# Patient Record
Sex: Female | Born: 1985 | Race: White | Hispanic: No | Marital: Single | Smoking: Current some day smoker
Health system: Southern US, Community
[De-identification: ages and names within clinical notes are randomized; demographics above are authoritative.]

## PROBLEM LIST (undated history)

## (undated) DIAGNOSIS — J45909 Unspecified asthma, uncomplicated: Secondary | ICD-10-CM

## (undated) DIAGNOSIS — F419 Anxiety disorder, unspecified: Secondary | ICD-10-CM

## (undated) DIAGNOSIS — R87629 Unspecified abnormal cytological findings in specimens from vagina: Secondary | ICD-10-CM

## (undated) HISTORY — DX: Anxiety disorder, unspecified: F41.9

## (undated) HISTORY — DX: Unspecified abnormal cytological findings in specimens from vagina: R87.629

## (undated) HISTORY — PX: EYE SURGERY: SHX253

## (undated) HISTORY — DX: Unspecified asthma, uncomplicated: J45.909

## (undated) HISTORY — PX: COLPOSCOPY: SHX161

## (undated) HISTORY — PX: LEEP: SHX91

---

## 2008-04-28 ENCOUNTER — Emergency Department: Payer: Self-pay | Admitting: Emergency Medicine

## 2008-11-15 ENCOUNTER — Emergency Department: Payer: Self-pay | Admitting: Emergency Medicine

## 2009-05-15 ENCOUNTER — Encounter: Payer: Self-pay | Admitting: Obstetrics and Gynecology

## 2009-10-19 ENCOUNTER — Observation Stay: Payer: Self-pay | Admitting: Obstetrics and Gynecology

## 2009-10-24 ENCOUNTER — Inpatient Hospital Stay: Payer: Self-pay | Admitting: Obstetrics and Gynecology

## 2009-10-29 IMAGING — US US OB < 14 WEEKS - US OB TV
1 series · 17 of 28 positions shown · non-contrast
Comparison: none

REASON FOR EXAM: Bleeding, uncertain dates
COMMENTS:

[Series 1: us ob < 14 weeks - us ob tv · 17 of 80 slices shown]
[im 1/80]
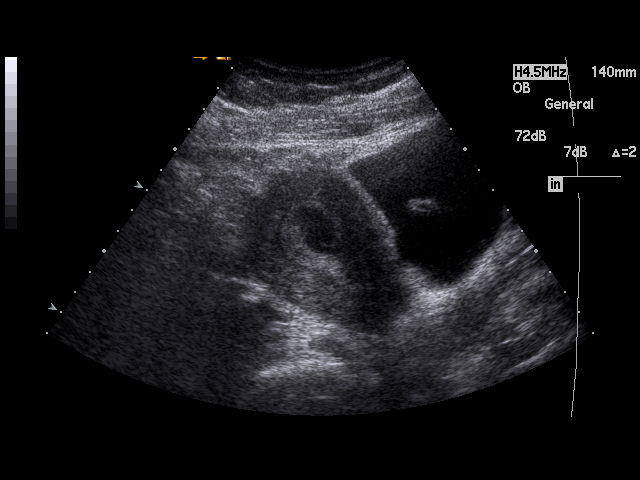
[im 6/80]
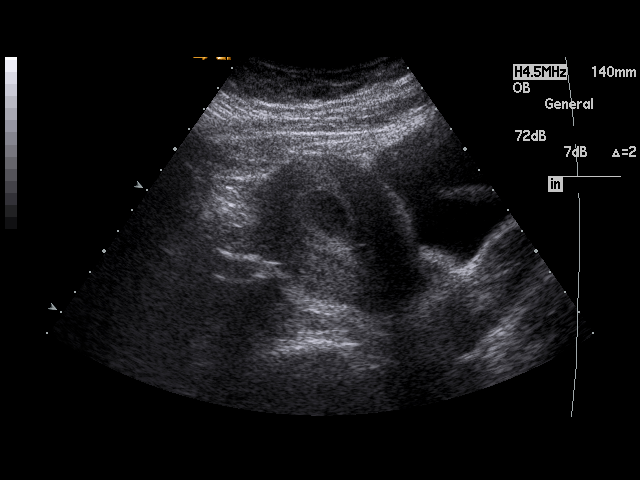
[im 12/80]
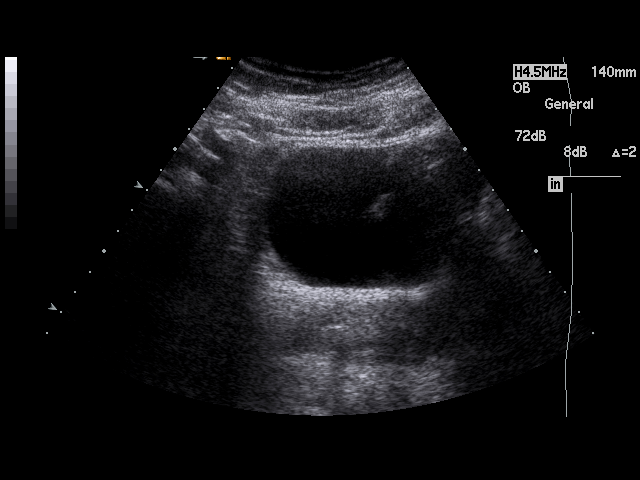
[im 15/80]
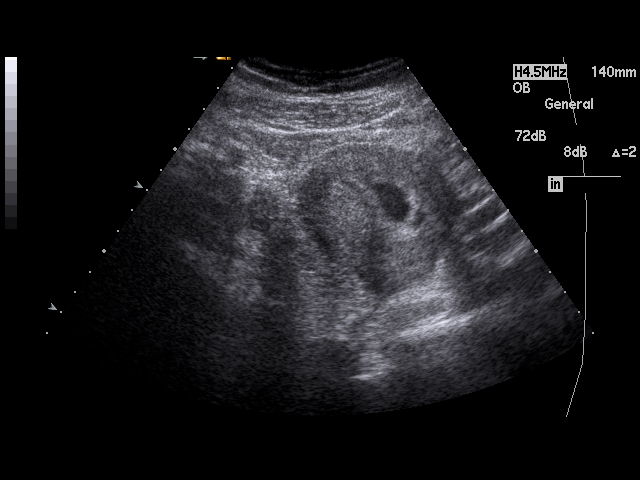
[im 21/80]
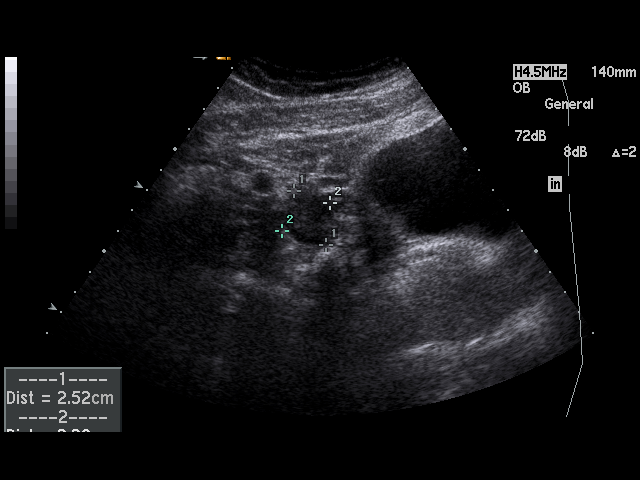
[im 27/80]
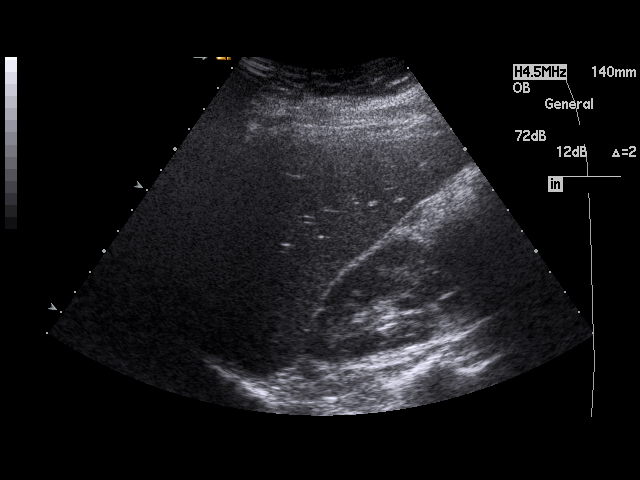
[im 30/80]
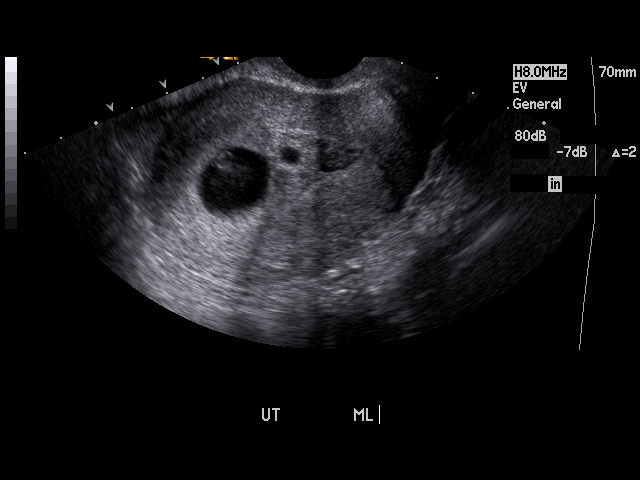
[im 36/80]
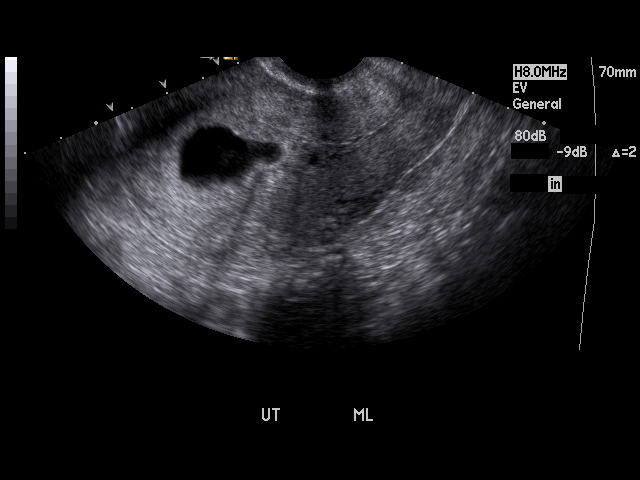
[im 41/80]
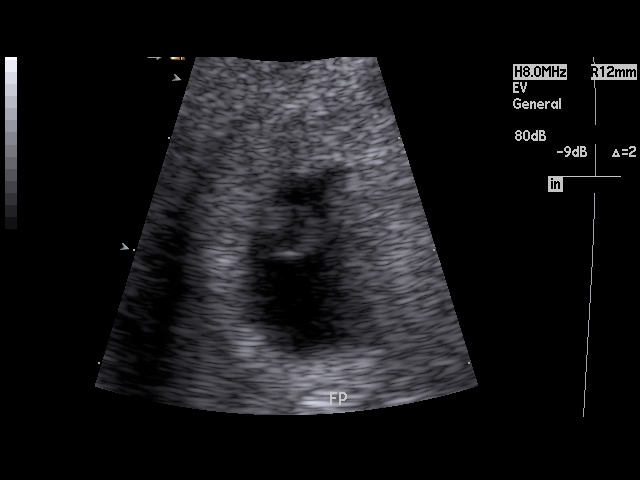
[im 44/80]
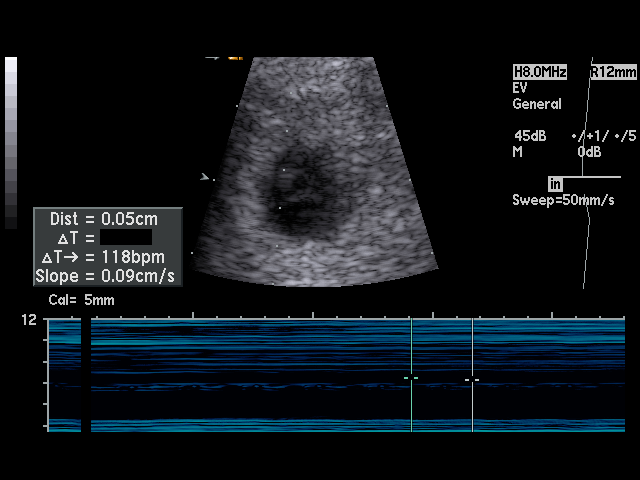
[im 50/80]
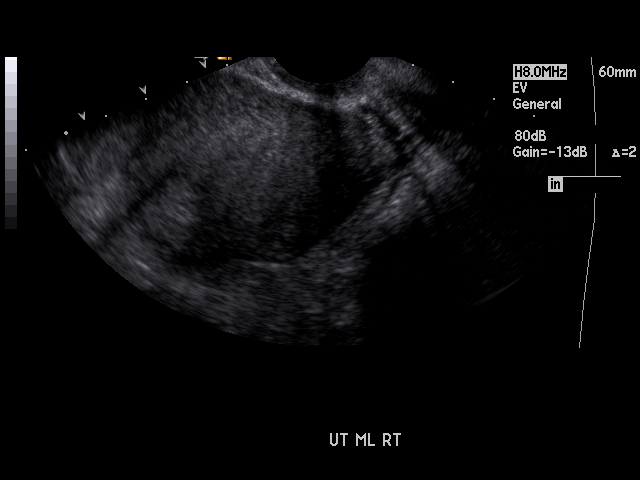
[im 53/80]
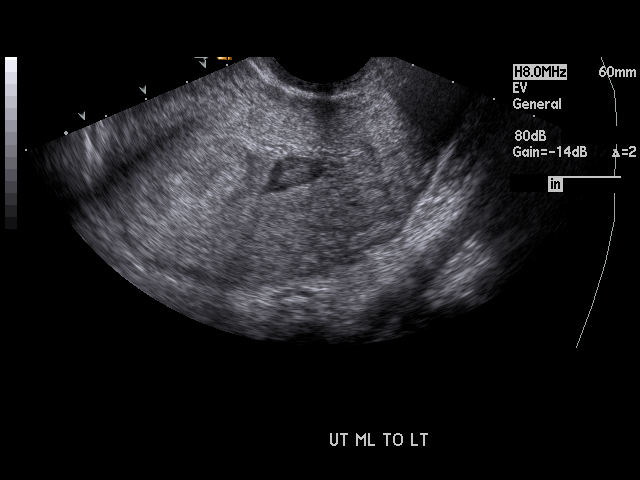
[im 59/80]
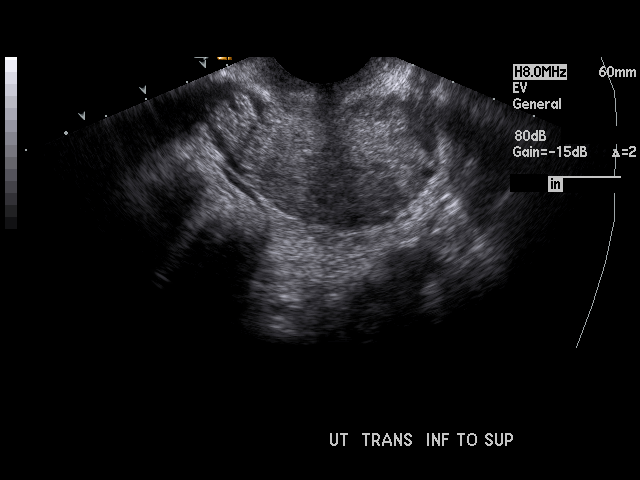
[im 65/80]
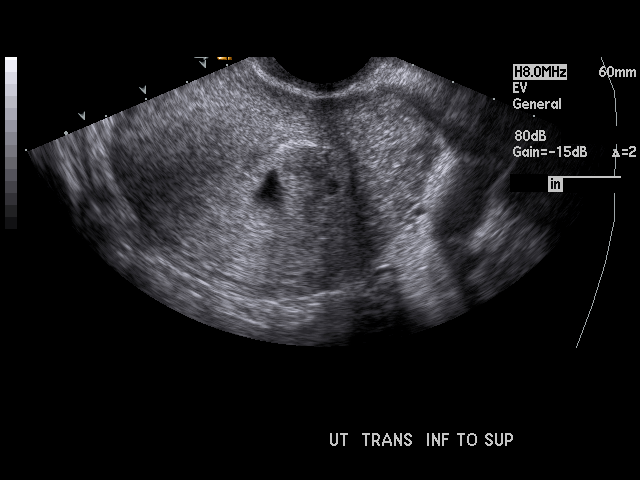
[im 68/80]
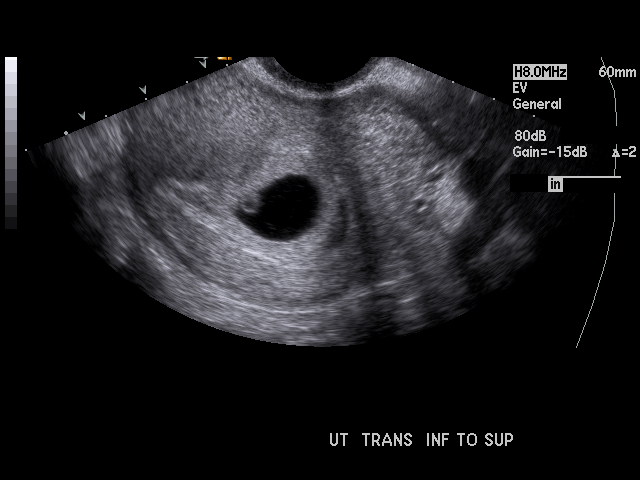
[im 74/80]
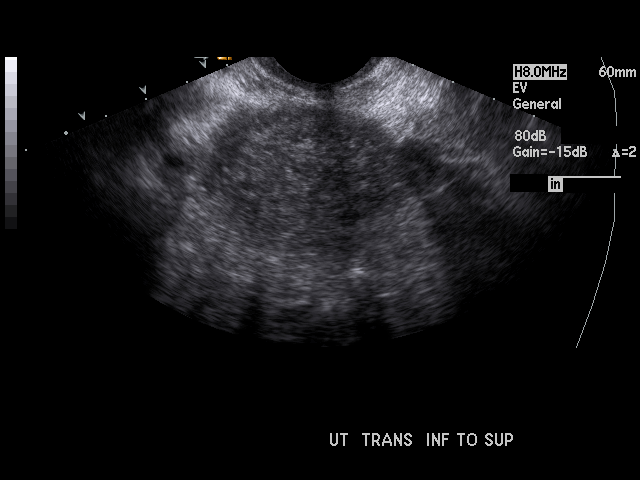
[im 80/80]
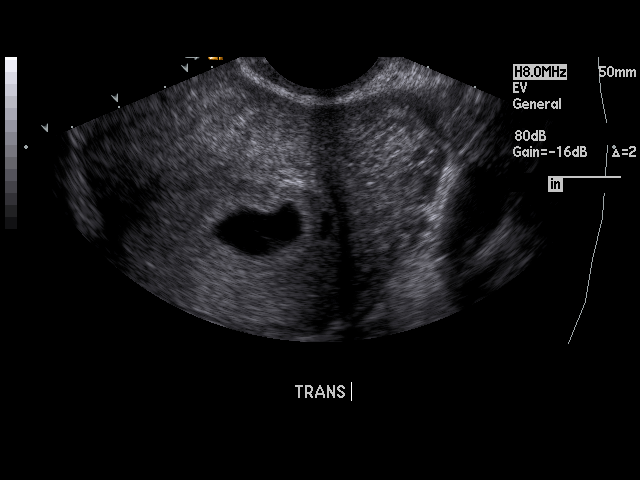

[17 of 28 positions shown; findings below may reference images not displayed]

PROCEDURE:     US  - US OB LESS THAN 14 WEEKS/W TRANS  - November 15, 2008  [DATE]

RESULT:     Transabdominal and endovaginal ultrasound was performed. There
is observed a living intrauterine gestation. Embryo heart rate was monitored
at 118 beats per minute. The crown-rump length measures 0.21 cm which
corresponds to an estimated gestational age of 5 weeks, 5 days. Ultrasound
EDD is 07/13/2009. There is noted a subchorionic bleed. There is a lucent
tract extending into the endometrial canal and consistent with blood and
thrombus.
IMPRESSION: 1.  Living intrauterine gestation of approximately 5 weeks, 5 days
gestational age.
2.  There is a subchorionic bleed with a linear tract visualized in the
endometrial canal compatible with blood and clot that measures approximately
3.95 cm x 0.8 cm.

## 2012-12-29 ENCOUNTER — Ambulatory Visit: Payer: Self-pay | Admitting: Family Medicine

## 2014-09-13 DIAGNOSIS — Z8709 Personal history of other diseases of the respiratory system: Secondary | ICD-10-CM | POA: Insufficient documentation

## 2014-12-30 DIAGNOSIS — Z9889 Other specified postprocedural states: Secondary | ICD-10-CM

## 2014-12-30 HISTORY — DX: Other specified postprocedural states: Z98.890

## 2015-07-05 DIAGNOSIS — N879 Dysplasia of cervix uteri, unspecified: Secondary | ICD-10-CM | POA: Insufficient documentation

## 2016-09-26 DIAGNOSIS — Z8742 Personal history of other diseases of the female genital tract: Secondary | ICD-10-CM | POA: Insufficient documentation

## 2016-09-26 DIAGNOSIS — Z8669 Personal history of other diseases of the nervous system and sense organs: Secondary | ICD-10-CM | POA: Insufficient documentation

## 2018-07-28 LAB — HM PAP SMEAR: HM Pap smear: NEGATIVE

## 2018-07-28 LAB — HM HIV SCREENING LAB: HM HIV Screening: NEGATIVE

## 2019-08-24 DIAGNOSIS — Z8709 Personal history of other diseases of the respiratory system: Secondary | ICD-10-CM

## 2019-08-24 DIAGNOSIS — Z8669 Personal history of other diseases of the nervous system and sense organs: Secondary | ICD-10-CM

## 2019-08-24 DIAGNOSIS — Z8742 Personal history of other diseases of the female genital tract: Secondary | ICD-10-CM

## 2019-08-25 ENCOUNTER — Ambulatory Visit: Payer: Self-pay

## 2019-10-14 ENCOUNTER — Ambulatory Visit: Payer: Self-pay

## 2019-10-20 ENCOUNTER — Ambulatory Visit: Payer: Self-pay

## 2020-10-27 ENCOUNTER — Ambulatory Visit (LOCAL_COMMUNITY_HEALTH_CENTER): Payer: Self-pay

## 2020-10-27 ENCOUNTER — Other Ambulatory Visit: Payer: Self-pay

## 2020-10-27 VITALS — BP 130/83 | Ht 65.0 in | Wt 218.5 lb

## 2020-10-27 DIAGNOSIS — Z3201 Encounter for pregnancy test, result positive: Secondary | ICD-10-CM

## 2020-10-27 LAB — PREGNANCY, URINE: Preg Test, Ur: POSITIVE — AB

## 2020-10-27 NOTE — Progress Notes (Signed)
Pt desires prenatal care at [facility]; sent to preadmit. 

## 2020-12-07 ENCOUNTER — Ambulatory Visit: Payer: Medicaid Other | Admitting: Family Medicine

## 2020-12-07 ENCOUNTER — Encounter: Payer: Self-pay | Admitting: Physician Assistant

## 2020-12-07 ENCOUNTER — Other Ambulatory Visit: Payer: Self-pay

## 2020-12-07 VITALS — BP 130/80 | HR 99 | Temp 98.1°F | Wt 219.8 lb

## 2020-12-07 DIAGNOSIS — O0990 Supervision of high risk pregnancy, unspecified, unspecified trimester: Secondary | ICD-10-CM | POA: Insufficient documentation

## 2020-12-07 DIAGNOSIS — Z8742 Personal history of other diseases of the female genital tract: Secondary | ICD-10-CM

## 2020-12-07 DIAGNOSIS — O09519 Supervision of elderly primigravida, unspecified trimester: Secondary | ICD-10-CM

## 2020-12-07 DIAGNOSIS — O99210 Obesity complicating pregnancy, unspecified trimester: Secondary | ICD-10-CM

## 2020-12-07 DIAGNOSIS — Z8709 Personal history of other diseases of the respiratory system: Secondary | ICD-10-CM

## 2020-12-07 LAB — HEMOGLOBIN, FINGERSTICK: Hemoglobin: 12.7 g/dL (ref 11.1–15.9)

## 2020-12-07 LAB — OB RESULTS CONSOLE GC/CHLAMYDIA
Chlamydia: NEGATIVE
Gonorrhea: NEGATIVE

## 2020-12-07 LAB — URINALYSIS
Bilirubin, UA: NEGATIVE
Glucose, UA: NEGATIVE
Ketones, UA: NEGATIVE
Leukocytes,UA: NEGATIVE
Nitrite, UA: NEGATIVE
Protein,UA: NEGATIVE
RBC, UA: NEGATIVE
Specific Gravity, UA: 1.025 (ref 1.005–1.030)
Urobilinogen, Ur: 0.2 mg/dL (ref 0.2–1.0)
pH, UA: 7 (ref 5.0–7.5)

## 2020-12-07 LAB — WET PREP FOR TRICH, YEAST, CLUE
Trichomonas Exam: NEGATIVE
Yeast Exam: NEGATIVE

## 2020-12-07 LAB — OB RESULTS CONSOLE VARICELLA ZOSTER ANTIBODY, IGG: Varicella: IMMUNE

## 2020-12-07 NOTE — Progress Notes (Addendum)
Wet mount reviewed, no treatment indicated. Urine dip and Hgb reviewed with provider, no new orders. Patient peak flows:  320, 310, 390, with good effort, reviewed by provider. [facility] referral for 1st trimester screening with U/S faxed with confirmation. Patient counseled to go to [facility] today or at next appointment. Patient declined flu vaccine today, but agreed to consider it for next visit.Burt Knack, RN

## 2020-12-07 NOTE — Progress Notes (Signed)
[facility] HEALTH DEPT [facility] [address] [place] [address] [place] [address] [phone]  INITIAL PRENATAL VISIT NOTE  Subjective:  Anne Harrington is a 34 y.o. [phone] at [redacted]w[redacted]d being seen today to start prenatal care at the [facility].  She is currently monitored for the following issues for this high-risk pregnancy and has History of migraine headaches; History of abnormal cervical Pap smear; History of asthma; Supervision of high risk pregnancy, antepartum; Obesity in pregnancy, antepartum; and Advanced maternal age, primigravida, antepartum on their problem list.  Patient reports no complaints.  Contractions: Not present. Vag. Bleeding: None.  Movement: Present. Denies leaking of fluid.   Indications for ASA therapy (per uptodate) One of the following: Previous pregnancy with preeclampsia, especially early onset and with an adverse outcome No Multifetal gestation No Chronic hypertension No Type 1 or 2 diabetes mellitus No Chronic kidney disease No Autoimmune disease (antiphospholipid syndrome, systemic lupus erythematosus) No  Two or more of the following: Nulliparity No Obesity (body mass index >30 kg/m2) Yes Family history of preeclampsia in mother or sister No Age ?35 years Yes Sociodemographic characteristics (African American race, low socioeconomic level) No Personal risk factors (eg, previous pregnancy with low birth weight or small for gestational age infant, previous adverse pregnancy outcome [eg, stillbirth], interval >10 years between pregnancies) Yes   The following portions of the patient's history were reviewed and updated as appropriate: allergies, current medications, past family history, past medical history, past social history, past surgical history and problem list. Problem list updated.  Objective:   Vitals:   12/07/20 1328  BP: 130/80  Pulse: 99  Temp: 98.1 F (36.7 C)  Weight: 219 lb  12.8 oz (99.7 kg)    Fetal Status: Fetal Heart Rate (bpm): 163 Fundal Height: 12 cm Movement: Present     Physical Exam Vitals and nursing note reviewed.  Constitutional:      Appearance: Normal appearance.  HENT:     Head: Normocephalic and atraumatic.     Mouth/Throat:     Mouth: Mucous membranes are moist.     Pharynx: Oropharynx is clear. No oropharyngeal exudate or posterior oropharyngeal erythema.     Comments: Broken right bottom molar  Eyes:     General: No scleral icterus. Cardiovascular:     Rate and Rhythm: Normal rate.     Pulses: Normal pulses.  Pulmonary:     Effort: Pulmonary effort is normal.  Abdominal:     General: Abdomen is flat. Bowel sounds are normal.     Palpations: Abdomen is soft.  Genitourinary:    Comments:  External genitalia without, lice, nits, erythema, edema , lesions or inguinal adenopathy. Vagina with normal mucosa and discharge and pH equals 4.    Cervix without visual lesions, uterus firm, mobile, non-tender, no masses, CMT adnexal fullness or tenderness.   Musculoskeletal:        General: Normal range of motion.     Cervical back: Normal range of motion and neck supple.  Lymphadenopathy:     Cervical: No cervical adenopathy.  Skin:    General: Skin is warm and dry.  Neurological:     General: No focal deficit present.     Mental Status: She is alert and oriented to person, place, and time.  Psychiatric:        Mood and Affect: Mood normal.        Behavior: Behavior normal.     Assessment and Plan:  Pregnancy:  [id number] at [redacted]w[redacted]d  1. Supervision of high risk pregnancy, antepartum  Patient is happy about this pregnancy.  She lives with her 64 yr old son.  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  The FOB is Alona Bene and is supportive of this pregnancy.    She does have nausea in the mornings and not able to eat meals until after mid morning, she has been taking gummy PNV daily, she reports  was not able  to keep the tablets down .  Discussed methods to help with n/v, information sheet given.    Denies chronic medical issues other than asthma   Last dental visit was 5/202, patient had broken bottom molar on right side.  Encourage patient to continue with dental as needed.   2. Obesity in pregnancy, antepartum Discussed recommended wt gain (11-20 lbs), healthy diet, and physical activity in pregnancy. Pt accepts  MNT referral. -Early GTT today, -Pt informed of starting  81mg  aspirin daily from 12-[redacted] wks gestation. Handout with instructions given.  -May consider serial growth scans starting at 28 wks.   3. History of abnormal cervical Pap smear Patient has history of previous abnormal pap with leep in 2016 and colpo in 2017.   Last pap was 2019 with cotesting- neg.    4. History of asthma Patient reports last asthma attack was in 04/2020, when postiive for covid.  Patient has albuterol inhaler.    PFT  Done today  5. Advanced maternal age, antepartum  Discussed genetic  counseling  Patient accepts testing  Discussed overview of care and coordination with inpatient delivery practices including [facility], 05/2020, [facility] and [facility].   Patient chooses [facility]    Preterm labor symptoms and general obstetric precautions including but not limited to vaginal bleeding, contractions, leaking of fluid and fetal movement were reviewed in detail with the patient.  Please refer to After Visit Summary for other counseling recommendations.   Return in about 4 weeks (around 01/04/2021) for routine prenatal care.  Future Appointments  Date Time Provider Department Center  01/04/2021  2:00 PM [facility] PROVIDER [facility] None    03/04/2021, FNP

## 2020-12-07 NOTE — Progress Notes (Signed)
Patient here for new OB visit at 11 6/7. States she had covid 19 in February 2020 and states she has some "pressure around my heart" that "intensifies with menstruation". She states she was smoking when she got covid and quit afterward to try to improve chest symptoms. This is why she has anxiety. Patient lives with her 35 year old son. 1 hour gtt blood draw at 3:15, patient needs to be to lab about 5-10 minutes before that time.Marland KitchenMarland KitchenBurt Knack, RN

## 2020-12-08 ENCOUNTER — Other Ambulatory Visit: Payer: Self-pay | Admitting: Family Medicine

## 2020-12-08 DIAGNOSIS — O26899 Other specified pregnancy related conditions, unspecified trimester: Secondary | ICD-10-CM | POA: Insufficient documentation

## 2020-12-08 DIAGNOSIS — Z369 Encounter for antenatal screening, unspecified: Secondary | ICD-10-CM

## 2020-12-08 DIAGNOSIS — Z6791 Unspecified blood type, Rh negative: Secondary | ICD-10-CM | POA: Insufficient documentation

## 2020-12-08 LAB — CBC/D/PLT+RPR+RH+ABO+AB SCR
Antibody Screen: NEGATIVE
Basophils Absolute: 0 10*3/uL (ref 0.0–0.2)
Basos: 0 %
EOS (ABSOLUTE): 0 10*3/uL (ref 0.0–0.4)
Eos: 0 %
Hematocrit: 36.8 % (ref 34.0–46.6)
Hemoglobin: 12.7 g/dL (ref 11.1–15.9)
Hepatitis B Surface Ag: NEGATIVE
Immature Grans (Abs): 0 10*3/uL (ref 0.0–0.1)
Immature Granulocytes: 0 %
Lymphocytes Absolute: 2.1 10*3/uL (ref 0.7–3.1)
Lymphs: 19 %
MCH: 32.1 pg (ref 26.6–33.0)
MCHC: 34.5 g/dL (ref 31.5–35.7)
MCV: 93 fL (ref 79–97)
Monocytes Absolute: 0.6 10*3/uL (ref 0.1–0.9)
Monocytes: 6 %
Neutrophils Absolute: 8 10*3/uL — ABNORMAL HIGH (ref 1.4–7.0)
Neutrophils: 75 %
Platelets: 255 10*3/uL (ref 150–450)
RBC: 3.96 x10E6/uL (ref 3.77–5.28)
RDW: 11.9 % (ref 11.7–15.4)
RPR Ser Ql: NONREACTIVE
Rh Factor: NEGATIVE
WBC: 10.8 10*3/uL (ref 3.4–10.8)

## 2020-12-08 LAB — COMPREHENSIVE METABOLIC PANEL
ALT: 39 IU/L — ABNORMAL HIGH (ref 0–32)
AST: 24 IU/L (ref 0–40)
Albumin/Globulin Ratio: 1.4 (ref 1.2–2.2)
Albumin: 4.1 g/dL (ref 3.8–4.8)
Alkaline Phosphatase: 69 IU/L (ref 44–121)
BUN/Creatinine Ratio: 8 — ABNORMAL LOW (ref 9–23)
BUN: 5 mg/dL — ABNORMAL LOW (ref 6–20)
Bilirubin Total: <0.2 mg/dL (ref 0.0–1.2)
CO2: 24 mmol/L (ref 20–29)
Calcium: 9.5 mg/dL (ref 8.7–10.2)
Chloride: 103 mmol/L (ref 96–106)
Creatinine, Ser: 0.62 mg/dL (ref 0.57–1.00)
GFR calc Af Amer: 135 mL/min/{1.73_m2} (ref >59)
GFR calc non Af Amer: 117 mL/min/{1.73_m2} (ref >59)
Globulin, Total: 3 g/dL (ref 1.5–4.5)
Glucose: 82 mg/dL (ref 65–99)
Potassium: 3.6 mmol/L (ref 3.5–5.2)
Sodium: 140 mmol/L (ref 134–144)
Total Protein: 7.1 g/dL (ref 6.0–8.5)

## 2020-12-08 LAB — GLUCOSE, 1 HOUR GESTATIONAL: Gestational Diabetes Screen: 72 mg/dL (ref 65–139)

## 2020-12-08 LAB — LEAD, BLOOD (ADULT >= 16 YRS): Lead-Whole Blood: <1 ug/dL (ref 0–4)

## 2020-12-08 LAB — VARICELLA ZOSTER ANTIBODY, IGG: Varicella zoster IgG: 1990 index (ref >165)

## 2020-12-08 LAB — HGB A1C W/O EAG: Hgb A1c MFr Bld: 4.8 % (ref 4.8–5.6)

## 2020-12-08 LAB — HCV AB W REFLEX TO QUANT PCR: HCV Ab: <0.1 s/co ratio (ref 0.0–0.9)

## 2020-12-08 LAB — HCV INTERPRETATION

## 2020-12-08 LAB — TSH: TSH: 2.53 u[IU]/mL (ref 0.450–4.500)

## 2020-12-08 LAB — HIV ANTIBODY (ROUTINE TESTING W REFLEX): HIV Screen 4th Generation wRfx: NONREACTIVE

## 2020-12-11 ENCOUNTER — Encounter: Payer: Self-pay | Admitting: Family Medicine

## 2020-12-11 ENCOUNTER — Telehealth: Payer: Self-pay

## 2020-12-11 ENCOUNTER — Telehealth: Payer: Self-pay | Admitting: Family Medicine

## 2020-12-11 DIAGNOSIS — R799 Abnormal finding of blood chemistry, unspecified: Secondary | ICD-10-CM | POA: Insufficient documentation

## 2020-12-11 LAB — CHLAMYDIA/GC NAA, CONFIRMATION
Chlamydia trachomatis, NAA: NEGATIVE
Neisseria gonorrhoeae, NAA: NEGATIVE

## 2020-12-11 NOTE — Telephone Encounter (Signed)
Call to client to ascertain if aware of 12/14/2020 1000 genetic counseling and 1100 [place] appt at [facility]. Per client, was notified this am of appt. Jossie Ng, RN

## 2020-12-11 NOTE — Telephone Encounter (Signed)
Call to patient about appointment for genetics and U/S on 12/16 @ 10 am @[facility]  medical Mall.   Patient verbalized understanding.    , FNP

## 2020-12-13 LAB — PROTEIN / CREATININE RATIO, URINE
Creatinine, Urine: 149 mg/dL
Protein, Ur: 7.5 mg/dL
Protein/Creat Ratio: 50 mg/g creat (ref 0–200)

## 2020-12-13 LAB — URINE CULTURE

## 2020-12-14 ENCOUNTER — Ambulatory Visit (HOSPITAL_BASED_OUTPATIENT_CLINIC_OR_DEPARTMENT_OTHER): Payer: Medicaid Other

## 2020-12-14 ENCOUNTER — Other Ambulatory Visit: Payer: Self-pay

## 2020-12-14 ENCOUNTER — Other Ambulatory Visit
Admission: RE | Admit: 2020-12-14 | Discharge: 2020-12-14 | Disposition: A | Discharge status: Home / Self Care | Payer: Medicaid Other | Source: Ambulatory Visit | Attending: Obstetrics | Admitting: Obstetrics

## 2020-12-14 VITALS — BP 130/78 | HR 98 | Temp 98.1°F | Wt 224.5 lb

## 2020-12-14 DIAGNOSIS — Z369 Encounter for antenatal screening, unspecified: Secondary | ICD-10-CM | POA: Diagnosis not present

## 2020-12-14 DIAGNOSIS — R799 Abnormal finding of blood chemistry, unspecified: Secondary | ICD-10-CM

## 2020-12-14 DIAGNOSIS — Z3689 Encounter for other specified antenatal screening: Secondary | ICD-10-CM | POA: Diagnosis not present

## 2020-12-14 DIAGNOSIS — Z3A12 12 weeks gestation of pregnancy: Secondary | ICD-10-CM | POA: Diagnosis not present

## 2020-12-14 DIAGNOSIS — O09521 Supervision of elderly multigravida, first trimester: Secondary | ICD-10-CM

## 2020-12-14 DIAGNOSIS — Z8279 Family history of other congenital malformations, deformations and chromosomal abnormalities: Secondary | ICD-10-CM

## 2020-12-14 DIAGNOSIS — O351XX0 Maternal care for (suspected) chromosomal abnormality in fetus, not applicable or unspecified: Secondary | ICD-10-CM

## 2020-12-14 NOTE — Progress Notes (Signed)
Referring Provider:  [facility] Department Length of Consultation: 40 minutes  Anne Harrington was referred to Maternal Fetal Care at [facility] for genetic counseling because of advanced maternal age and a family history of spina bifida.  The patient will be 34 years old at the time of delivery.  This note summarizes the information we discussed.    We explained that the chance of a chromosome abnormality increases with maternal age.  Chromosomes and examples of chromosome problems were reviewed.  Humans typically have 46 chromosomes in each cell, with half passed through each sperm and egg.  Any change in the number or structure of chromosomes can increase the risk of problems in the physical and mental development of a pregnancy.   Based upon age of the patient and the current gestational age, the chance of any chromosome abnormality was 1 in 75. The chance of Down syndrome, the most common chromosome problem associated with maternal age, was 1 in 103.  The risk of chromosome problems is in addition to the 3% general population risk for birth defects and intellectual disabilities.  The greatest chance, of course, is that the baby would be born in good health.  We discussed the following prenatal screening and testing options for this pregnancy:  Cell free fetal DNA testing analyzes maternal blood to determine whether or not the baby may have Down syndrome, trisomy 38, or trisomy 39.  This test utilizes a maternal blood sample and DNA sequencing technology to isolate circulating cell free fetal DNA from maternal plasma.  The fetal DNA can then be analyzed for DNA sequences that are derived from the three most common chromosomes involved in aneuploidy, chromosomes 13, 18, and 21.  If the overall amount of DNA is greater than the expected level for any of these chromosomes, aneuploidy is suspected.  While we do not consider it a replacement for invasive testing and karyotype analysis, a  negative result from this testing would be reassuring, though not a guarantee of a normal chromosome complement for the baby.  An abnormal result is certainly suggestive of an abnormal chromosome complement, though we would still recommend CVS or amniocentesis to confirm any findings from this testing.  First trimester screening, is another blood test which includes nuchal translucency ultrasound screen and first trimester maternal serum marker screening.  The nuchal translucency has approximately an 80% detection rate for Down syndrome and can be positive for other chromosome abnormalities as well as heart defects.  When combined with a maternal serum marker screening, the detection rate is up to 90% for Down syndrome and up to 97% for trisomy 18.     The chorionic villus sampling procedure is available for first trimester chromosome analysis.  This involves the withdrawal of a small amount of chorionic villi (tissue from the developing placenta).  Risk of pregnancy loss is estimated to be approximately 1 in 200 to 1 in 100 (0.5 to 1%).  There is approximately a 1% (1 in 100) chance that the CVS chromosome results will be unclear.  Chorionic villi cannot be tested for neural tube defects.     Maternal serum marker screening, a blood test that measures pregnancy proteins, can provide risk assessments for Down syndrome, trisomy 18, and open neural tube defects (spina bifida, anencephaly). Because it does not directly examine the fetus, it cannot positively diagnose or rule out these problems. The detection rate is approximately 75% for Down syndrome, 70% for trisomy 18 and 80% of open neural tube  defects. If prior chromosome screening has been performed, then AFP only is recommended to test for open neural tube defects alone.  Targeted ultrasound uses high frequency sound waves to create an image of the developing fetus.  An ultrasound is often recommended as a routine means of evaluating the pregnancy.  It  is also used to screen for fetal anatomy problems (for example, a heart defect) that might be suggestive of a chromosomal or other abnormality.   Amniocentesis involves the removal of a small amount of amniotic fluid from the sac surrounding the fetus with the use of a thin needle inserted through the maternal abdomen and uterus.  Ultrasound guidance is used throughout the procedure.  Fetal cells from amniotic fluid are directly evaluated and > 99.5% of chromosome problems and > 98% of open neural tube defects can be detected. This procedure is generally performed after the 15th week of pregnancy.  The main risks to this procedure include complications leading to miscarriage in less than 1 in 200 cases (0.5%).  Cystic Fibrosis and Spinal Muscular Atrophy (SMA) screening were also discussed with the patient. Both conditions are recessive, which means that both parents must be carriers in order to have a child with the disease.  Cystic fibrosis (CF) is one of the most common genetic conditions in persons of Caucasian ancestry.  This condition occurs in approximately 1 in 2,500 Caucasian persons and results in thickened secretions in the lungs, digestive, and reproductive systems.  For a baby to be at risk for having CF, both of the parents must be carriers for this condition.  Approximately 1 in 75 Caucasian persons is a carrier for CF.  Current carrier testing looks for the most common mutations in the gene for CF and can detect approximately 90% of carriers in the Caucasian population.  This means that the carrier screening can greatly reduce, but cannot eliminate, the chance for an individual to have a child with CF.  If an individual is found to be a carrier for CF, then carrier testing would be available for the partner. As part of [place]'s newborn screening profile, all babies born in the state of [place] will have a two-tier screening process.  Specimens are first tested to determine the  concentration of immunoreactive trypsinogen (IRT).  The top 5% of specimens with the highest IRT values then undergo DNA testing using a panel of over 40 common CF mutations. SMA is a neurodegenerative disorder that leads to atrophy of skeletal muscle and overall weakness.  This condition is also more prevalent in the Caucasian population, with 1 in 40-1 in 60 persons being a carrier and 1 in 6,000-1 in 10,000 children being affected.  There are multiple forms of the disease, with some causing death in infancy to other forms with survival into adulthood.  The genetics of SMA is complex, but carrier screening can detect up to 95% of carriers in the Caucasian population.  Similar to CF, a negative result can greatly reduce, but cannot eliminate, the chance to have a child with SMA.  Hemoglobinopathy screening was also offered to the patient. Carrier screening for hemoglobinopathies was also made available. The couple are of European Caucasian ancestry.  We obtained a detailed family history and pregnancy history.  The patient reported that she has one son from a prior relationship.  He is 38 years old and in good health.  She stated that her paternal half sister was born with a spina bifida.  She actually underwent  in utero surgery and is now 35 years old.  She is able to walk and has no cognitive delays.  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  To the patient's knowledge, her sister does not have any underlying genetic syndrome or other health concerns unrelated to the neural tube defect. We reviewed that open neural tube defects happen due to a failure of the tissues over the spine to close at around 28 days after conception.  It may occur as an isolated birth defect or as part of numerous genetic syndromes.  In the absence of a known syndrome, the condition is thought to be due to multifactorial inheritance, or a combination of genetic as well as  environmental or other factors.  In a second degree relative, we would estimate a 1% or less chance for this pregnancy to be affected with an open neural tube defect.  We discussed the use of ultrasound in the second trimester as well as maternal serum AFP screening at 16-20 weeks to assess for open neural tube defects. The remainder of the family history is unremarkable for birth defects, developmental delays, recurrent pregnancy loss or known chromosome abnormalities.  Anne Harrington reported no complications or exposures in this pregnancy.  She reported no use of tobacco, alcohol, recreational drugs or medications.  An ultrasound was performed at the time of the visit.  The gestational age was consistent with  12 weeks, 6 days (consisent with her LMP).  Fetal anatomy could not be assessed due to early gestational age.  Please refer to the ultrasound report for details of that study.  Anne Harrington was encouraged to call with questions or concerns.  We can be contacted at [phone].  Plan of care:  MaterniT21 PLUS with SCA ordered today  Return to Maternal Fetal Care clinic for detailed anatomy ultrasound at 19 weeks  Maternal serum AFP only screening to be draw by [facility] at 16-20 weeks  Declined carrier screening for CF, SMA and hemoglobinopathies   Cherly Anderson, MS, CGC

## 2020-12-18 LAB — MATERNIT21 PLUS CORE+SCA
Fetal Fraction: 7
Monosomy X (Turner Syndrome): NOT DETECTED
Result (T21): NEGATIVE
Trisomy 13 (Patau syndrome): NEGATIVE
Trisomy 18 (Edwards syndrome): NEGATIVE
Trisomy 21 (Down syndrome): NEGATIVE
XXX (Triple X Syndrome): NOT DETECTED
XXY (Klinefelter Syndrome): NOT DETECTED
XYY (Jacobs Syndrome): NOT DETECTED

## 2020-12-21 ENCOUNTER — Telehealth: Payer: Self-pay | Admitting: Obstetrics and Gynecology

## 2020-12-21 NOTE — Telephone Encounter (Signed)
The patient was informed of the results of her recent MaterniT21 testing which yielded NEGATIVE results.  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  The sensitivity for trisomy 6, trisomy 53 and trisomy 30 using this testing are reported as 99.1%, 99.9% and 91.7% respectively.  Thus, while the results of this testing are highly accurate, they are not considered diagnostic at this time.  Should more definitive information be desired, the patient may still consider amniocentesis.   As requested to know by the patient, sex chromosome analysis was included for this sample.  Results are consistent with a female fetus. This is predicted with >99% accuracy. There was also no evidence of sex chromosome aneuploidy in the pregnancy.  A maternal serum AFP only should be considered if screening for neural tube defects is desired.  We may be reached at [phone] with any questions or concerns.   Cherly Anderson, MS, CGC

## 2020-12-26 ENCOUNTER — Encounter: Payer: Self-pay | Admitting: Family Medicine

## 2020-12-26 DIAGNOSIS — O0990 Supervision of high risk pregnancy, unspecified, unspecified trimester: Secondary | ICD-10-CM

## 2020-12-26 DIAGNOSIS — R799 Abnormal finding of blood chemistry, unspecified: Secondary | ICD-10-CM

## 2020-12-30 NOTE — L&D Delivery Note (Signed)
Delivery Note  Anne Harrington is a [id number] at [redacted]w[redacted]d with an LMP of 09/15/2021, consistent with [place] at [redacted]w[redacted]d.   First Stage: Labor onset: 2200 Induction: misoprostol, oxytocin, and AROM Analgesia /Anesthesia intrapartum: Epidural AROM at 2327 for meconium stained fluid   Second Stage: Complete dilation at 0158 Onset of pushing at 0200 FHR second stage 145 with moderate variability, early and variables   Ed presented to L&D for evaluation of elevated blood pressures in the office.  She was admitted for IOL d/t gestational hypertension.  She progressed well after 1 dose of misoprostol and low dose oxytocin.  She had a spontaneous urge to push after AROM and pushed effectively over 2 contractions for a spontaneous birth. Delivery of a viable baby boy on 06/22/2021 at 0204 by CNM Delivery of fetal head in OA position with restitution to ROT. No nuchal cord;  Anterior then posterior shoulders delivered easily with gentle downward traction. Baby placed on mom's chest, and attended to by baby RN Cord double clamped after cessation of pulsation, cut by father of baby.  Cord blood sample collected: Yes - O neg  Third Stage: Oxytocin bolus started after delivery of infant for hemorrhage prophylaxis  Placenta delivered intact with 3 VC @ 0215 Placenta disposition: discarded Uterine tone firm / bleeding firm  1st vaginal laceration identified  Anesthesia for repair: Epidural Repair with 2-0 Vicryl CT in usual fashion Est. Blood Loss (mL): 400 ml   Complications: None  Mom to postpartum.  Baby to Couplet care / Skin to Skin.  Newborn: Information for the patient's newborn:  Berneta, Sconyers [[id number]]  Live born female  Birth Weight: 8 lb 15.2 oz (4060 g) APGAR: 8, 9  Newborn Delivery   Time head delivered: 06/23/2021 02:04:00 Birth date/time: 06/23/2021 02:04:00 Delivery type: Vaginal, Spontaneous      Feeding planned: breast   ---------- Margaretmary Eddy, CNM Certified Nurse  Midwife [facility]

## 2021-01-04 ENCOUNTER — Ambulatory Visit: Payer: Medicaid Other | Admitting: Family Medicine

## 2021-01-04 ENCOUNTER — Other Ambulatory Visit: Payer: Self-pay

## 2021-01-04 VITALS — BP 134/83 | HR 96 | Temp 98.5°F | Wt 221.4 lb

## 2021-01-04 DIAGNOSIS — O0990 Supervision of high risk pregnancy, unspecified, unspecified trimester: Secondary | ICD-10-CM

## 2021-01-04 DIAGNOSIS — O99210 Obesity complicating pregnancy, unspecified trimester: Secondary | ICD-10-CM

## 2021-01-04 DIAGNOSIS — Z8709 Personal history of other diseases of the respiratory system: Secondary | ICD-10-CM

## 2021-01-04 DIAGNOSIS — R799 Abnormal finding of blood chemistry, unspecified: Secondary | ICD-10-CM

## 2021-01-04 DIAGNOSIS — O09519 Supervision of elderly primigravida, unspecified trimester: Secondary | ICD-10-CM

## 2021-01-04 NOTE — Progress Notes (Signed)
   PRENATAL VISIT NOTE  Subjective:  Anne Harrington is a 35 y.o. [phone] at [redacted]w[redacted]d being seen today for ongoing prenatal care.  She is currently monitored for the following issues for this high-risk pregnancy and has History of migraine headaches; History of abnormal cervical Pap smear; History of asthma; Supervision of high risk pregnancy, antepartum; Obesity in pregnancy, antepartum; Advanced maternal age, primigravida, antepartum; Rh negative state in antepartum period; and Abnormal blood findings on their problem list.  Patient reports nausea & vomiting .  Contractions: Not present. Vag. Bleeding: None.  Movement: Present. Denies leaking of fluid/ROM.   The following portions of the patient's history were reviewed and updated as appropriate: allergies, current medications, past family history, past medical history, past social history, past surgical history and problem list. Problem list updated.  Objective:   Vitals:   01/04/21 1433  BP: 134/83  Pulse: 96  Temp: 98.5 F (36.9 C)  Weight: 221 lb 6.4 oz (100.4 kg)    Fetal Status: Fetal Heart Rate (bpm): 157 Fundal Height: 15 cm Movement: Present     General:  Alert, oriented and cooperative. Patient is in no acute distress.  Skin: Skin is warm and dry. No rash noted.   Cardiovascular: Normal heart rate noted  Respiratory: Normal respiratory effort, no problems with respiration noted  Abdomen: Soft, gravid, appropriate for gestational age.  Pain/Pressure: Absent     Pelvic: Cervical exam deferred        Extremities: Normal range of motion.  Edema: None  Mental Status: Normal mood and affect. Normal behavior. Normal judgment and thought content.   Assessment and Plan:  Pregnancy: G5P1031 at [redacted]w[redacted]d  1. Supervision of high risk pregnancy, antepartum Reports things are going well and only concerns about N/V Patient has had N/V intermittently.  Discussed small frequent meals, changing to eating more bland meals and methods to prevent  nausea.  Information sheet given.  Patient to call back to clinic in 5 days if still having episodes.    Repeat of CMP done today.    Anatomy scan appointment scheduled for 01/25/21.    2. History of asthma  no problems with asthma   3. Obesity in pregnancy, antepartum Patient has loss 3 lbs since last visit.    4. Advanced maternal age, primigravida, antepartum AFP drawn today. Discussed results of Genetic testing.  Patient reports she received call about results  - AFP Only [facility]- Scanned result  5. Abnormal blood findings  Alt was 46 at last appointment,  - Comprehensive metabolic panel   Preterm labor symptoms and general obstetric precautions including but not limited to vaginal bleeding, contractions, leaking of fluid and fetal movement were reviewed in detail with the patient. Please refer to After Visit Summary for other counseling recommendations.  Return in about 4 weeks (around 02/01/2021) for routine prenatal care.  Future Appointments  Date Time Provider Department Center  01/25/2021  9:00 AM [facility] US1 [facility]    Wendi Snipes, FNP

## 2021-01-04 NOTE — Progress Notes (Signed)
Rh negative counseling completed. Repeat CMP today. Fu vaccine declined again today.  AFP declined today. Jossie Ng, RN

## 2021-01-05 LAB — COMPREHENSIVE METABOLIC PANEL
ALT: 14 IU/L (ref 0–32)
AST: 13 IU/L (ref 0–40)
Albumin/Globulin Ratio: 1.4 (ref 1.2–2.2)
Albumin: 4 g/dL (ref 3.8–4.8)
Alkaline Phosphatase: 68 IU/L (ref 44–121)
BUN/Creatinine Ratio: 10 (ref 9–23)
BUN: 5 mg/dL — ABNORMAL LOW (ref 6–20)
Bilirubin Total: <0.2 mg/dL (ref 0.0–1.2)
CO2: 20 mmol/L (ref 20–29)
Calcium: 9.1 mg/dL (ref 8.7–10.2)
Chloride: 103 mmol/L (ref 96–106)
Creatinine, Ser: 0.52 mg/dL — ABNORMAL LOW (ref 0.57–1.00)
GFR calc Af Amer: 143 mL/min/{1.73_m2} (ref >59)
GFR calc non Af Amer: 124 mL/min/{1.73_m2} (ref >59)
Globulin, Total: 2.9 g/dL (ref 1.5–4.5)
Glucose: 75 mg/dL (ref 65–99)
Potassium: 3.9 mmol/L (ref 3.5–5.2)
Sodium: 138 mmol/L (ref 134–144)
Total Protein: 6.9 g/dL (ref 6.0–8.5)

## 2021-01-23 ENCOUNTER — Other Ambulatory Visit: Payer: Self-pay | Admitting: General Practice

## 2021-01-23 DIAGNOSIS — O09522 Supervision of elderly multigravida, second trimester: Secondary | ICD-10-CM

## 2021-01-25 ENCOUNTER — Other Ambulatory Visit: Payer: Self-pay

## 2021-01-25 ENCOUNTER — Ambulatory Visit: Payer: Medicaid Other | Attending: Obstetrics

## 2021-01-25 DIAGNOSIS — E669 Obesity, unspecified: Secondary | ICD-10-CM | POA: Insufficient documentation

## 2021-01-25 DIAGNOSIS — O09522 Supervision of elderly multigravida, second trimester: Secondary | ICD-10-CM

## 2021-01-25 DIAGNOSIS — O99212 Obesity complicating pregnancy, second trimester: Secondary | ICD-10-CM | POA: Insufficient documentation

## 2021-01-25 DIAGNOSIS — R799 Abnormal finding of blood chemistry, unspecified: Secondary | ICD-10-CM

## 2021-01-25 DIAGNOSIS — O321XX0 Maternal care for breech presentation, not applicable or unspecified: Secondary | ICD-10-CM

## 2021-01-25 DIAGNOSIS — O350XX0 Maternal care for (suspected) central nervous system malformation in fetus, not applicable or unspecified: Secondary | ICD-10-CM

## 2021-01-25 DIAGNOSIS — Z3A18 18 weeks gestation of pregnancy: Secondary | ICD-10-CM

## 2021-01-26 ENCOUNTER — Encounter: Payer: Self-pay | Admitting: Family Medicine

## 2021-01-26 DIAGNOSIS — O35EXX0 Maternal care for other (suspected) fetal abnormality and damage, fetal genitourinary anomalies, not applicable or unspecified: Secondary | ICD-10-CM | POA: Insufficient documentation

## 2021-01-26 DIAGNOSIS — O358XX0 Maternal care for other (suspected) fetal abnormality and damage, not applicable or unspecified: Secondary | ICD-10-CM | POA: Insufficient documentation

## 2021-01-29 ENCOUNTER — Encounter: Payer: Self-pay | Admitting: Family Medicine

## 2021-01-29 DIAGNOSIS — O0990 Supervision of high risk pregnancy, unspecified, unspecified trimester: Secondary | ICD-10-CM

## 2021-02-01 ENCOUNTER — Ambulatory Visit: Payer: Medicaid Other | Admitting: Family Medicine

## 2021-02-01 ENCOUNTER — Other Ambulatory Visit: Payer: Self-pay

## 2021-02-01 VITALS — BP 115/79 | HR 86 | Temp 97.8°F | Wt 219.4 lb

## 2021-02-01 DIAGNOSIS — O99210 Obesity complicating pregnancy, unspecified trimester: Secondary | ICD-10-CM

## 2021-02-01 DIAGNOSIS — O0990 Supervision of high risk pregnancy, unspecified, unspecified trimester: Secondary | ICD-10-CM

## 2021-02-01 NOTE — Progress Notes (Signed)
Aware of [facility] [place] appt 02/22/2021 at 1 pm. Jossie Ng, RN

## 2021-02-01 NOTE — Progress Notes (Signed)
   PRENATAL VISIT NOTE  Subjective:  Anne Harrington is a 35 y.o. [phone] at [redacted]w[redacted]d being seen today for ongoing prenatal care.  She is currently monitored for the following issues for this high-risk pregnancy and has History of migraine headaches; History of abnormal cervical Pap smear; History of asthma; Supervision of high risk pregnancy, antepartum; Obesity in pregnancy, antepartum; Advanced maternal age, primigravida, antepartum; Rh negative state in antepartum period; Abnormal blood findings; and Fetal renal anomaly, single gestation on their problem list.  Patient reports backache and headache.  Contractions: Not present. Vag. Bleeding: None.  Movement: Present. Denies leaking of fluid/ROM.   The following portions of the patient's history were reviewed and updated as appropriate: allergies, current medications, past family history, past medical history, past social history, past surgical history and problem list. Problem list updated.  Objective:   Vitals:   02/01/21 1533  BP: 115/79  Pulse: 86  Temp: 97.8 F (36.6 C)  Weight: 219 lb 6.4 oz (99.5 kg)    Fetal Status: Fetal Heart Rate (bpm): 149 Fundal Height: 19 cm Movement: Present     General:  Alert, oriented and cooperative. Patient is in no acute distress.  Skin: Skin is warm and dry. No rash noted.   Cardiovascular: Normal heart rate noted  Respiratory: Normal respiratory effort, no problems with respiration noted  Abdomen: Soft, gravid, appropriate for gestational age.  Pain/Pressure: Absent     Pelvic: Cervical exam deferred        Extremities: Normal range of motion.  Edema: None  Mental Status: Normal mood and affect. Normal behavior. Normal judgment and thought content.   Assessment and Plan:  Pregnancy: G5P1031 at [redacted]w[redacted]d  1. Supervision of high risk pregnancy, antepartum 2. Obesity in pregnancy, antepartum  Patient reports things are going well with pregnancy.  Nausea is much better but having some indigestion.   Discussed eating more bland foods, increasing protein and small frequent meals.    Patient reports some lower back on occasion, gave information sheet with exercises to help.    Patient reports having HA on yesterday but Tylenol helped, pt has hx of HA, last HA was ~2 months ago.  Ask patient to notify clinic if HA increase in frequency or intensity.    Denies and s/sx of asthma, but having some nasal passage dryness, d/t heat in home,  Recommended to use simple saline to moisten nasal passages.    Discussed PP contraception and Pt is thinking about pills and wants something with lower estrogen.    Discussed AFP results - negative with patient.  Patient verbalized understanding.       Preterm labor symptoms and general obstetric precautions including but not limited to vaginal bleeding, contractions, leaking of fluid and fetal movement were reviewed in detail with the patient. Please refer to After Visit Summary for other counseling recommendations.  No follow-ups on file.  Future Appointments  Date Time Provider Department Center  02/22/2021  1:00 PM [facility]    Wendi Snipes, FNP

## 2021-02-06 ENCOUNTER — Telehealth: Payer: Self-pay | Admitting: Family Medicine

## 2021-02-06 NOTE — Telephone Encounter (Signed)
Pt. needs a referral to her Dentist  appt 02/07/21  @ [facility] [phone]

## 2021-02-20 ENCOUNTER — Other Ambulatory Visit: Payer: Self-pay | Admitting: Family Medicine

## 2021-02-20 DIAGNOSIS — O350XX0 Maternal care for (suspected) central nervous system malformation in fetus, not applicable or unspecified: Secondary | ICD-10-CM

## 2021-02-20 DIAGNOSIS — O09522 Supervision of elderly multigravida, second trimester: Secondary | ICD-10-CM

## 2021-02-20 DIAGNOSIS — O99212 Obesity complicating pregnancy, second trimester: Secondary | ICD-10-CM

## 2021-02-20 DIAGNOSIS — O3503X0 Maternal care for (suspected) central nervous system malformation or damage in fetus, choroid plexus cysts, not applicable or unspecified: Secondary | ICD-10-CM

## 2021-02-22 ENCOUNTER — Ambulatory Visit: Payer: Medicaid Other | Attending: Maternal & Fetal Medicine

## 2021-02-22 ENCOUNTER — Other Ambulatory Visit: Payer: Self-pay

## 2021-02-22 DIAGNOSIS — E669 Obesity, unspecified: Secondary | ICD-10-CM | POA: Insufficient documentation

## 2021-02-22 DIAGNOSIS — O3503X0 Maternal care for (suspected) central nervous system malformation or damage in fetus, choroid plexus cysts, not applicable or unspecified: Secondary | ICD-10-CM

## 2021-02-22 DIAGNOSIS — Z3A22 22 weeks gestation of pregnancy: Secondary | ICD-10-CM | POA: Diagnosis not present

## 2021-02-22 DIAGNOSIS — O99212 Obesity complicating pregnancy, second trimester: Secondary | ICD-10-CM | POA: Insufficient documentation

## 2021-02-22 DIAGNOSIS — O09522 Supervision of elderly multigravida, second trimester: Secondary | ICD-10-CM | POA: Diagnosis not present

## 2021-02-22 DIAGNOSIS — O350XX0 Maternal care for (suspected) central nervous system malformation in fetus, not applicable or unspecified: Secondary | ICD-10-CM | POA: Diagnosis not present

## 2021-03-12 ENCOUNTER — Other Ambulatory Visit: Payer: Self-pay

## 2021-03-12 ENCOUNTER — Ambulatory Visit: Payer: Medicaid Other | Admitting: Family Medicine

## 2021-03-12 VITALS — Wt 222.8 lb

## 2021-03-12 DIAGNOSIS — Z6791 Unspecified blood type, Rh negative: Secondary | ICD-10-CM

## 2021-03-12 DIAGNOSIS — O09519 Supervision of elderly primigravida, unspecified trimester: Secondary | ICD-10-CM

## 2021-03-12 DIAGNOSIS — O26899 Other specified pregnancy related conditions, unspecified trimester: Secondary | ICD-10-CM

## 2021-03-12 DIAGNOSIS — O99210 Obesity complicating pregnancy, unspecified trimester: Secondary | ICD-10-CM

## 2021-03-12 DIAGNOSIS — O0990 Supervision of high risk pregnancy, unspecified, unspecified trimester: Secondary | ICD-10-CM

## 2021-03-12 NOTE — Progress Notes (Signed)
   PRENATAL VISIT NOTE  Subjective:  Anne Harrington is a 35 y.o. [phone] at [redacted]w[redacted]d being seen today for ongoing prenatal care.  She is currently monitored for the following issues for this high-risk pregnancy and has History of migraine headaches; History of abnormal cervical Pap smear; History of asthma; Supervision of high risk pregnancy, antepartum; Obesity in pregnancy, antepartum; Advanced maternal age, primigravida, antepartum; Rh negative state in antepartum period; Abnormal blood findings; and Fetal renal anomaly, single gestation on their problem list.  Patient reports no complaints.  Contractions: Not present. Vag. Bleeding: None.  Movement: Present. Denies leaking of fluid/ROM.   The following portions of the patient's history were reviewed and updated as appropriate: allergies, current medications, past family history, past medical history, past social history, past surgical history and problem list. Problem list updated.  Objective:   Vitals:   03/12/21 1331  Weight: 222 lb 12.8 oz (101.1 kg)    Fetal Status: Fetal Heart Rate (bpm): 160 Fundal Height: 26 cm Movement: Present     General:  Alert, oriented and cooperative. Patient is in no acute distress.  Skin: Skin is warm and dry. No rash noted.   Cardiovascular: Normal heart rate noted  Respiratory: Normal respiratory effort, no problems with respiration noted  Abdomen: Soft, gravid, appropriate for gestational age.  Pain/Pressure: Absent     Pelvic: Cervical exam deferred        Extremities: Normal range of motion.  Edema: None  Mental Status: Normal mood and affect. Normal behavior. Normal judgment and thought content.   Assessment and Plan:  Pregnancy: G5P1031 at [redacted]w[redacted]d  1. Supervision of high risk pregnancy, antepartum 2. Obesity in pregnancy, antepartum 3. Rh negative state in antepartum period 4. Advanced maternal age, primigravida, antepartum      Patient needs referral for dentist office to have cavities  filled.  Referral completed and faxed by RN.  Patient reports interest in transferring care to [facility]. Discussed with patient on contacting [facility] for the transfer of care and that if not seen with in 3 weeks then needs to [facility] for PNV,  28 week labs and start of Rhogam.  Patient verbalizes understanding.  Patient reports transferring d/t [facility] is closer to her place of employment.    Patient is taking PNV and ASA daily as directed.  .   Plans to breast feed- discussed lactation consultant if needed  Pediatrician-  [facility] Patient is undecided on [facility]  For  PP contraception  Discussed Weight gain of 11-20 lbs as appropriate based on BMI Nutrition/exercise: encouraged to incorporate exercise  Expected pains of back and broad ligament   Preterm labor symptoms and general obstetric precautions including but not limited to vaginal bleeding, contractions, leaking of fluid and fetal movement were reviewed in detail with the patient. Please refer to After Visit Summary for other counseling recommendations.  Return in about 3 weeks (around 04/02/2021) for 28 week labs, routine prenatal care.  Future Appointments  Date Time Provider Department Center  04/02/2021  1:40 PM [facility] PROVIDER [facility] None  04/26/2021  1:00 PM [facility]    Wendi Snipes, FNP

## 2021-03-12 NOTE — Progress Notes (Signed)
Patient here for MH RV at 25 3/7. [facility] forms and PHQ9 today. S/S PTL reviewed and literature given. Patient wants to change OB providers and continue care at [facility] which is more convenient location. Patient needs dental care form faxed to [facility] at [facility].Burt Knack, RN

## 2021-03-12 NOTE — Progress Notes (Signed)
Patient given copy of dental referral and copy faxed to dental practice. Patient scheduled to return in 3 weeks for 28 week labs and rhophylac. Patient states she will try to schedule her next appointment at [facility] and let us know.Marland KitchenMarland KitchenBurt Knack, RN

## 2021-04-02 ENCOUNTER — Ambulatory Visit: Payer: Self-pay

## 2021-04-02 ENCOUNTER — Other Ambulatory Visit: Payer: Self-pay

## 2021-04-02 ENCOUNTER — Ambulatory Visit: Payer: Medicaid Other | Admitting: Family Medicine

## 2021-04-02 VITALS — BP 113/74 | HR 88 | Temp 98.1°F | Wt 228.0 lb

## 2021-04-02 DIAGNOSIS — O0993 Supervision of high risk pregnancy, unspecified, third trimester: Secondary | ICD-10-CM

## 2021-04-02 DIAGNOSIS — O26899 Other specified pregnancy related conditions, unspecified trimester: Secondary | ICD-10-CM

## 2021-04-02 DIAGNOSIS — Z23 Encounter for immunization: Secondary | ICD-10-CM | POA: Diagnosis not present

## 2021-04-02 DIAGNOSIS — Z6791 Unspecified blood type, Rh negative: Secondary | ICD-10-CM

## 2021-04-02 DIAGNOSIS — O0990 Supervision of high risk pregnancy, unspecified, unspecified trimester: Secondary | ICD-10-CM

## 2021-04-02 LAB — HEMOGLOBIN, FINGERSTICK: Hemoglobin: 12.1 g/dL (ref 11.1–15.9)

## 2021-04-02 MED ORDER — RHO D IMMUNE GLOBULIN 1500 UNIT/2ML IJ SOSY
300.0000 ug | PREFILLED_SYRINGE | Freq: Once | INTRAMUSCULAR | Status: AC
  Administered 2021-04-02: 300 ug via INTRAMUSCULAR

## 2021-04-02 NOTE — Progress Notes (Signed)
Rhophylac given, right gluteus muscle, tolerated well. Tdap given, patient given NCIR and VIS. Patient scheduled to return in 2 weeks.Burt Knack, RN

## 2021-04-02 NOTE — Progress Notes (Signed)
Patient here for MH RV at 28 3/7. Patient states she is planning to continue care at [facility] (not changing provider as thought last appt). 28 week labs today, needs antibody screen and Rhophylac. Patient is O-. Needs to be to lab for blood draw at 2:45.Burt Knack, RN

## 2021-04-02 NOTE — Progress Notes (Signed)
   PRENATAL VISIT NOTE  Subjective:  Anne Harrington is a 35 y.o. [phone] at [redacted]w[redacted]d being seen today for ongoing prenatal care.  She is currently monitored for the following issues for this high-risk pregnancy and has History of migraine headaches; History of abnormal cervical Pap smear; History of asthma; Supervision of high risk pregnancy, antepartum; Obesity in pregnancy, antepartum; Advanced maternal age, primigravida, antepartum; Rh negative state in antepartum period; Abnormal blood findings; and Fetal renal anomaly, single gestation on their problem list.  Patient reports no complaints.  Contractions: Not present. Vag. Bleeding: None.  Movement: Present. Denies leaking of fluid/ROM.   The following portions of the patient's history were reviewed and updated as appropriate: allergies, current medications, past family history, past medical history, past social history, past surgical history and problem list. Problem list updated.  Objective:   Vitals:   04/02/21 1348  BP: 113/74  Pulse: 88  Temp: 98.1 F (36.7 C)  Weight: 228 lb (103.4 kg)    Fetal Status: Fetal Heart Rate (bpm): 145 Fundal Height: 27 cm Movement: Present     General:  Alert, oriented and cooperative. Patient is in no acute distress.  Skin: Skin is warm and dry. No rash noted.   Cardiovascular: Normal heart rate noted  Respiratory: Normal respiratory effort, no problems with respiration noted  Abdomen: Soft, gravid, appropriate for gestational age.  Pain/Pressure: Absent     Pelvic: Cervical exam deferred        Extremities: Normal range of motion.  Edema: None  Mental Status: Normal mood and affect. Normal behavior. Normal judgment and thought content.   Assessment and Plan:  Pregnancy: G5P1031 at [redacted]w[redacted]d  1. Supervision of high risk pregnancy, antepartum -28 wk labs today. Hgb ordered.   Patient reports that she changed her mind about transferring care and wants to continue care at [facility].    -Reviewed  prenatal visit schedule q2 wks until 36 wks, then q1 wk.  -Discussed option of doula during delivery,pamphlet given, advised pt to call for more information.  Discussed PP contraception, IUD information given.  Discussed differences between IUD's   Patient taking ASA and PNV as directed.    Tdap given by RN   - RPR - HIV-1/HIV-2 Qualitative RNA - Glucose tolerance, 1 hour - Hemoglobin, fingerstick  2. Rh negative state in antepartum period Rhogam given today.    - Antibody screen     Preterm labor symptoms and general obstetric precautions including but not limited to vaginal bleeding, contractions, leaking of fluid and fetal movement were reviewed in detail with the patient. Please refer to After Visit Summary for other counseling recommendations.  No follow-ups on file.  Future Appointments  Date Time Provider Department Center  04/16/2021  1:20 PM [facility] PROVIDER [facility] None  04/26/2021  1:00 PM [facility]    Wendi Snipes, FNP

## 2021-04-03 LAB — GLUCOSE, 1 HOUR GESTATIONAL: Gestational Diabetes Screen: 112 mg/dL (ref 65–139)

## 2021-04-03 LAB — RPR: RPR Ser Ql: NONREACTIVE

## 2021-04-03 LAB — HIV-1/HIV-2 QUALITATIVE RNA
HIV-1 RNA, Qualitative: NONREACTIVE
HIV-2 RNA, Qualitative: NONREACTIVE

## 2021-04-03 LAB — ANTIBODY SCREEN: Antibody Screen: NEGATIVE

## 2021-04-14 ENCOUNTER — Other Ambulatory Visit: Payer: Self-pay | Admitting: Family Medicine

## 2021-04-14 DIAGNOSIS — O99213 Obesity complicating pregnancy, third trimester: Secondary | ICD-10-CM

## 2021-04-14 DIAGNOSIS — O09523 Supervision of elderly multigravida, third trimester: Secondary | ICD-10-CM

## 2021-04-14 DIAGNOSIS — O350XX0 Maternal care for (suspected) central nervous system malformation in fetus, not applicable or unspecified: Secondary | ICD-10-CM

## 2021-04-14 DIAGNOSIS — O3503X0 Maternal care for (suspected) central nervous system malformation or damage in fetus, choroid plexus cysts, not applicable or unspecified: Secondary | ICD-10-CM

## 2021-04-16 ENCOUNTER — Other Ambulatory Visit: Payer: Self-pay

## 2021-04-16 ENCOUNTER — Ambulatory Visit: Payer: Medicaid Other | Admitting: Family Medicine

## 2021-04-16 ENCOUNTER — Encounter: Payer: Self-pay | Admitting: Family Medicine

## 2021-04-16 VITALS — BP 116/75 | HR 95 | Temp 97.9°F | Wt 231.0 lb

## 2021-04-16 DIAGNOSIS — O0993 Supervision of high risk pregnancy, unspecified, third trimester: Secondary | ICD-10-CM

## 2021-04-16 DIAGNOSIS — O26893 Other specified pregnancy related conditions, third trimester: Secondary | ICD-10-CM

## 2021-04-16 DIAGNOSIS — O0990 Supervision of high risk pregnancy, unspecified, unspecified trimester: Secondary | ICD-10-CM

## 2021-04-16 DIAGNOSIS — O26899 Other specified pregnancy related conditions, unspecified trimester: Secondary | ICD-10-CM

## 2021-04-16 DIAGNOSIS — Z6791 Unspecified blood type, Rh negative: Secondary | ICD-10-CM

## 2021-04-16 DIAGNOSIS — O99213 Obesity complicating pregnancy, third trimester: Secondary | ICD-10-CM

## 2021-04-16 DIAGNOSIS — O99210 Obesity complicating pregnancy, unspecified trimester: Secondary | ICD-10-CM

## 2021-04-16 NOTE — Progress Notes (Signed)
Patient here for MH RV at 30 3/7. Patient aware of [facility] 04/26/2021 at 1:00pm. .Burt Knack, RN

## 2021-04-16 NOTE — Progress Notes (Signed)
   PRENATAL VISIT NOTE  Subjective:  Anne Harrington is a 35 y.o. [phone] at [redacted]w[redacted]d being seen today for ongoing prenatal care.  She is currently monitored for the following issues for this high-risk pregnancy and has History of migraine headaches; History of abnormal cervical Pap smear; History of asthma; Supervision of high risk pregnancy, antepartum; Obesity in pregnancy, antepartum; Advanced maternal age, primigravida, antepartum; Rh negative state in antepartum period; Abnormal blood findings; and Fetal renal anomaly, single gestation on their problem list.  Patient reports no complaints.  Contractions: Not present. Vag. Bleeding: None.  Movement: Present. Denies leaking of fluid/ROM.   The following portions of the patient's history were reviewed and updated as appropriate: allergies, current medications, past family history, past medical history, past social history, past surgical history and problem list. Problem list updated.  Objective:   Vitals:   04/16/21 1324  BP: 116/75  Pulse: 95  Temp: 97.9 F (36.6 C)  Weight: 231 lb (104.8 kg)    Fetal Status: Fetal Heart Rate (bpm): 145 Fundal Height: 31 cm Movement: Present  Presentation: Transverse  General:  Alert, oriented and cooperative. Patient is in no acute distress.  Skin: Skin is warm and dry. No rash noted.   Cardiovascular: Normal heart rate noted  Respiratory: Normal respiratory effort, no problems with respiration noted  Abdomen: Soft, gravid, appropriate for gestational age.  Pain/Pressure: Absent     Pelvic: Cervical exam deferred        Extremities: Normal range of motion.  Edema: None  Mental Status: Normal mood and affect. Normal behavior. Normal judgment and thought content.   Assessment and Plan:  Pregnancy: G5P1031 at [redacted]w[redacted]d  1. Supervision of high risk pregnancy, antepartum -Reviewed labs from 28 wk visit, results -As part of routine education we reviewed s/sx of pre-eclampsia and to seek medical care ASAP  if present.  -PP contraception plan is IUD: [place] or [place]  -Feeding plan is breast   2. Obesity in pregnancy, antepartum Patient is eating smaller more frequent meals and reports that heartburn is better because of eating more often.    3. Rh negative state in antepartum period No problems with or after the Rhogam shot    Preterm labor symptoms and general obstetric precautions including but not limited to vaginal bleeding, contractions, leaking of fluid and fetal movement were reviewed in detail with the patient. Please refer to After Visit Summary for other counseling recommendations.  Return in about 2 weeks (around 04/30/2021) for routine prenatal care.  Future Appointments  Date Time Provider Department Center  04/26/2021  1:00 PM [facility]  04/30/2021  3:00 PM [facility] PROVIDER [facility] None    Wendi Snipes, FNP

## 2021-04-26 ENCOUNTER — Other Ambulatory Visit: Payer: Self-pay

## 2021-04-26 ENCOUNTER — Ambulatory Visit: Payer: Medicaid Other | Attending: Maternal & Fetal Medicine

## 2021-04-26 DIAGNOSIS — Z3A31 31 weeks gestation of pregnancy: Secondary | ICD-10-CM | POA: Diagnosis not present

## 2021-04-26 DIAGNOSIS — O350XX0 Maternal care for (suspected) central nervous system malformation in fetus, not applicable or unspecified: Secondary | ICD-10-CM | POA: Insufficient documentation

## 2021-04-26 DIAGNOSIS — E669 Obesity, unspecified: Secondary | ICD-10-CM | POA: Diagnosis not present

## 2021-04-26 DIAGNOSIS — O99213 Obesity complicating pregnancy, third trimester: Secondary | ICD-10-CM | POA: Diagnosis not present

## 2021-04-26 DIAGNOSIS — O09523 Supervision of elderly multigravida, third trimester: Secondary | ICD-10-CM | POA: Diagnosis not present

## 2021-04-26 DIAGNOSIS — O3503X0 Maternal care for (suspected) central nervous system malformation or damage in fetus, choroid plexus cysts, not applicable or unspecified: Secondary | ICD-10-CM

## 2021-04-30 ENCOUNTER — Other Ambulatory Visit: Payer: Self-pay

## 2021-04-30 ENCOUNTER — Ambulatory Visit: Payer: Medicaid Other | Admitting: Family Medicine

## 2021-04-30 DIAGNOSIS — O0993 Supervision of high risk pregnancy, unspecified, third trimester: Secondary | ICD-10-CM

## 2021-04-30 DIAGNOSIS — O0990 Supervision of high risk pregnancy, unspecified, unspecified trimester: Secondary | ICD-10-CM

## 2021-04-30 NOTE — Progress Notes (Signed)
Kept 04/26/2021 [facility] [place]. Jossie Ng, RN

## 2021-04-30 NOTE — Progress Notes (Signed)
   PRENATAL VISIT NOTE  Subjective:  Anne Harrington is a 35 y.o. [phone] at [redacted]w[redacted]d being seen today for ongoing prenatal care.  She is currently monitored for the following issues for this high-risk pregnancy and has History of migraine headaches; History of abnormal cervical Pap smear; History of asthma; Supervision of high risk pregnancy, antepartum; Obesity in pregnancy, antepartum; Advanced maternal age, primigravida, antepartum; Rh negative state in antepartum period; Abnormal blood findings; and Fetal renal anomaly, single gestation on their problem list.  Patient reports backache.  Contractions: Not present. Vag. Bleeding: None.  Movement: Present. Denies leaking of fluid/ROM.   The following portions of the patient's history were reviewed and updated as appropriate: allergies, current medications, past family history, past medical history, past social history, past surgical history and problem list. Problem list updated.  Objective:  There were no vitals filed for this visit.  Fetal Status: Fetal Heart Rate (bpm): 142 Fundal Height: 33 cm Movement: Present  Presentation: Transverse  General:  Alert, oriented and cooperative. Patient is in no acute distress.  Skin: Skin is warm and dry. No rash noted.   Cardiovascular: Normal heart rate noted  Respiratory: Normal respiratory effort, no problems with respiration noted  Abdomen: Soft, gravid, appropriate for gestational age.  Pain/Pressure: Absent     Pelvic: Cervical exam deferred        Extremities: Normal range of motion.  Edema: None  Mental Status: Normal mood and affect. Normal behavior. Normal judgment and thought content.   Assessment and Plan:  Pregnancy: G5P1031 at [redacted]w[redacted]d  1. Supervision of high risk pregnancy, antepartum -Reviewed  MFM u/s- no more scheduled appointment, "reports to patient that everything looks fine".   Questions answered about PP IUD, patient is considering skyla or liletta.       Discussed 36 week  labs at next appointment and weekly visits    Preterm labor symptoms and general obstetric precautions including but not limited to vaginal bleeding, contractions, leaking of fluid and fetal movement were reviewed in detail with the patient. Please refer to After Visit Summary for other counseling recommendations.  Return in about 2 weeks (around 05/14/2021) for routine prenatal care, 36 week labs.  No future appointments.  Wendi Snipes, FNP

## 2021-05-14 ENCOUNTER — Other Ambulatory Visit: Payer: Self-pay

## 2021-05-14 ENCOUNTER — Ambulatory Visit: Payer: Medicaid Other | Admitting: Family Medicine

## 2021-05-14 VITALS — BP 124/80 | HR 79 | Temp 97.5°F | Wt 237.6 lb

## 2021-05-14 DIAGNOSIS — O0990 Supervision of high risk pregnancy, unspecified, unspecified trimester: Secondary | ICD-10-CM

## 2021-05-14 NOTE — Progress Notes (Signed)
   PRENATAL VISIT NOTE  Subjective:  Anne Harrington is a 35 y.o. [phone] at [redacted]w[redacted]d being seen today for ongoing prenatal care.  She is currently monitored for the following issues for this high-risk pregnancy and has History of migraine headaches; History of abnormal cervical Pap smear; History of asthma; Supervision of high risk pregnancy, antepartum; Obesity in pregnancy, antepartum; Advanced maternal age, primigravida, antepartum; Rh negative state in antepartum period; Abnormal blood findings; and Fetal renal anomaly, single gestation on their problem list.  Patient reports bladder pressure .  Contractions: Not present. Vag. Bleeding: None.  Movement: Present. Denies leaking of fluid/ROM.   The following portions of the patient's history were reviewed and updated as appropriate: allergies, current medications, past family history, past medical history, past social history, past surgical history and problem list. Problem list updated.  Objective:   Vitals:   05/14/21 1307  BP: 124/80  Pulse: 79  Temp: (!) 97.5 F (36.4 C)  Weight: 237 lb 9.6 oz (107.8 kg)    Fetal Status: Fetal Heart Rate (bpm): 143 Fundal Height: 35 cm Movement: Present  Presentation: Transverse  General:  Alert, oriented and cooperative. Patient is in no acute distress.  Skin: Skin is warm and dry. No rash noted.   Cardiovascular: Normal heart rate noted  Respiratory: Normal respiratory effort, no problems with respiration noted  Abdomen: Soft, gravid, appropriate for gestational age.  Pain/Pressure: Absent     Pelvic: Cervical exam deferred        Extremities: Normal range of motion.  Edema: None  Mental Status: Normal mood and affect. Normal behavior. Normal judgment and thought content.   Assessment and Plan:  Pregnancy: G5P1031 at [redacted]w[redacted]d  1. Supervision of high risk pregnancy, antepartum   - Pt reports having some bladder pressure and sometimes leaking a little urine. Discussed Kegel exercises and trying  to completely empty bladder when going to the  Bathroom. Discussed may need to wear mini- pad or panty liner.    - Reports that backache is better, starting to feel a little more fatigued than before.  Discussed taking rest breaks as needed.   -taking ASA and PNV as directed.  Discussed stopping ASA at 36 weeks.    -Pt reports taking Omega 3 with Vit D, it is a  2.5 gm tablet.  Discussed with pt per Up to date:  "The [place] Food and Drug Administration recommends that marine omega-3 fatty acid supplement labeling not recommend or suggest daily intake of more than 2 grams EPA and DHA , which is well above the amount recommended for pregnant people, females planning pregnancy, and breastfeeding people. Fish oil doses of 2.4 and 3.7 grams/day have been consumed in pregnancy without evidence of adverse effects".  Pt reports that she did her research and these particular vitamins are safe for pregnancy but she will check the dosage of the omega in this Vitamin.      Term labor symptoms and general obstetric precautions including but not limited to vaginal bleeding, contractions, leaking of fluid and fetal movement were reviewed in detail with the patient. Please refer to After Visit Summary for other counseling recommendations.  Return in about 2 weeks (around 05/28/2021) for 36 week labs.  Future Appointments  Date Time Provider Department Center  05/29/2021  1:20 PM [facility] PROVIDER [facility] None    Wendi Snipes, FNP

## 2021-05-23 NOTE — Addendum Note (Signed)
Addended by: Heywood Bene on: 05/23/2021 04:15 PM   Modules accepted: Orders

## 2021-05-29 ENCOUNTER — Other Ambulatory Visit: Payer: Self-pay

## 2021-05-29 ENCOUNTER — Ambulatory Visit: Payer: Medicaid Other | Admitting: Family Medicine

## 2021-05-29 VITALS — BP 126/86 | HR 96 | Temp 97.9°F | Wt 245.0 lb

## 2021-05-29 DIAGNOSIS — O0993 Supervision of high risk pregnancy, unspecified, third trimester: Secondary | ICD-10-CM

## 2021-05-29 DIAGNOSIS — O0990 Supervision of high risk pregnancy, unspecified, unspecified trimester: Secondary | ICD-10-CM

## 2021-05-29 LAB — OB RESULTS CONSOLE GC/CHLAMYDIA
Chlamydia: NEGATIVE
Gonorrhea: NEGATIVE

## 2021-05-29 LAB — URINALYSIS
Bilirubin, UA: NEGATIVE
Glucose, UA: NEGATIVE
Ketones, UA: NEGATIVE
Leukocytes,UA: NEGATIVE
Nitrite, UA: NEGATIVE
Protein,UA: NEGATIVE
RBC, UA: NEGATIVE
Specific Gravity, UA: 1.015 (ref 1.005–1.030)
Urobilinogen, Ur: 0.2 mg/dL (ref 0.2–1.0)
pH, UA: 6 (ref 5.0–7.5)

## 2021-05-29 NOTE — Progress Notes (Signed)
   PRENATAL VISIT NOTE  Subjective:  Anne Harrington is a 35 y.o. [phone] at [redacted]w[redacted]d being seen today for ongoing prenatal care.  She is currently monitored for the following issues for this high-risk pregnancy and has History of migraine headaches; History of abnormal cervical Pap smear; History of asthma; Supervision of high risk pregnancy, antepartum; Obesity in pregnancy, antepartum; Advanced maternal age, primigravida, antepartum; Rh negative state in antepartum period; Abnormal blood findings; and Fetal renal anomaly, single gestation on their problem list.  Patient reports swelling since stopped ASA .  Contractions: Irritability. Vag. Bleeding: None.  Movement: Present. Denies leaking of fluid/ROM.   The following portions of the patient's history were reviewed and updated as appropriate: allergies, current medications, past family history, past medical history, past social history, past surgical history and problem list. Problem list updated.  Objective:   Vitals:   05/29/21 1315 05/29/21 1323  BP: 137/80 126/86  Pulse: 90 96  Temp: 97.9 F (36.6 C)   Weight: 245 lb (111.1 kg)     Fetal Status: Fetal Heart Rate (bpm): 144 Fundal Height: 36 cm Movement: Present     General:  Alert, oriented and cooperative. Patient is in no acute distress.  Skin: Skin is warm and dry. No rash noted.   Cardiovascular: Normal heart rate noted  Respiratory: Normal respiratory effort, no problems with respiration noted  Abdomen: Soft, gravid, appropriate for gestational age.  Pain/Pressure: Absent     Pelvic: Cervical exam performed        Extremities: Normal range of motion.  Edema: Trace  Mental Status: Normal mood and affect. Normal behavior. Normal judgment and thought content.   Assessment and Plan:  Pregnancy: G5P1031 at [redacted]w[redacted]d  1. Supervision of high risk pregnancy, antepartum - BP slightly elevated, pt denies any symptoms of preeclampsia - discussed with patient about edema, swelling does  go down once elevates legs.   -36 wk labs today. -PHQ-9 score 0  -Reviewed s/sx of labor, when to go to hospital.  -Pt has carseat, not in car, discussed fire department can  -Anticipatory guidance given regarding visits q1 wk until 40 wks. -Advised pt to STOP taking aspirin for remainder of pregnancy.   - Urinalysis (Urine Dip) - GBS Culture - Chlamydia/GC NAA, Confirmation   Preterm labor symptoms and general obstetric precautions including but not limited to vaginal bleeding, contractions, leaking of fluid and fetal movement were reviewed in detail with the patient. Please refer to After Visit Summary for other counseling recommendations.  Return in about 1 week (around 06/05/2021) for routine prenatal care.  Future Appointments  Date Time Provider Department Center  06/05/2021  1:40 PM [facility] PROVIDER [facility] None    Wendi Snipes, FNP

## 2021-05-29 NOTE — Progress Notes (Signed)
Urine dip reviewed. WNL Provider, Elveria Rising, FNP reviewed.   Floy Sabina, RN

## 2021-05-31 LAB — CHLAMYDIA/GC NAA, CONFIRMATION
Chlamydia trachomatis, NAA: NEGATIVE
Neisseria gonorrhoeae, NAA: NEGATIVE

## 2021-06-02 LAB — CULTURE, BETA STREP (GROUP B ONLY): Strep Gp B Culture: NEGATIVE

## 2021-06-05 ENCOUNTER — Other Ambulatory Visit: Payer: Self-pay

## 2021-06-05 ENCOUNTER — Ambulatory Visit: Payer: Medicaid Other | Admitting: Family Medicine

## 2021-06-05 VITALS — BP 131/79 | HR 92 | Temp 98.1°F | Wt 246.2 lb

## 2021-06-05 DIAGNOSIS — O09519 Supervision of elderly primigravida, unspecified trimester: Secondary | ICD-10-CM

## 2021-06-05 DIAGNOSIS — O0990 Supervision of high risk pregnancy, unspecified, unspecified trimester: Secondary | ICD-10-CM

## 2021-06-05 LAB — URINALYSIS
Bilirubin, UA: NEGATIVE
Glucose, UA: NEGATIVE
Ketones, UA: NEGATIVE
Leukocytes,UA: NEGATIVE
Nitrite, UA: NEGATIVE
Protein,UA: NEGATIVE
RBC, UA: NEGATIVE
Specific Gravity, UA: 1.015 (ref 1.005–1.030)
Urobilinogen, Ur: 0.2 mg/dL (ref 0.2–1.0)
pH, UA: 6 (ref 5.0–7.5)

## 2021-06-05 NOTE — Progress Notes (Signed)
   PRENATAL VISIT NOTE  Subjective:  Anne Harrington is a 35 y.o. [phone] at [redacted]w[redacted]d being seen today for ongoing prenatal care.  She is currently monitored for the following issues for this high-risk pregnancy and has History of migraine headaches; History of abnormal cervical Pap smear; History of asthma; Supervision of high risk pregnancy, antepartum; Obesity in pregnancy, antepartum; Advanced maternal age, primigravida, antepartum; Rh negative state in antepartum period; Abnormal blood findings; and Fetal renal anomaly, single gestation on their problem list.  Patient reports headache.  Contractions: Irritability. Vag. Bleeding: None.  Movement: Present. Denies leaking of fluid/ROM.   The following portions of the patient's history were reviewed and updated as appropriate: allergies, current medications, past family history, past medical history, past social history, past surgical history and problem list. Problem list updated.  Objective:   Vitals:   06/05/21 1336  BP: 131/79  Pulse: 92  Temp: 98.1 F (36.7 C)  Weight: 246 lb 3.2 oz (111.7 kg)    Fetal Status: Fetal Heart Rate (bpm): 147 Fundal Height: 38 cm Movement: Present     General:  Alert, oriented and cooperative. Patient is in no acute distress.  Skin: Skin is warm and dry. No rash noted.   Cardiovascular: Normal heart rate noted  Respiratory: Normal respiratory effort, no problems with respiration noted  Abdomen: Soft, gravid, appropriate for gestational age.  Pain/Pressure: Absent     Pelvic: Cervical exam deferred        Extremities: Normal range of motion.  Edema: Trace  Mental Status: Normal mood and affect. Normal behavior. Normal judgment and thought content.   Assessment and Plan:  Pregnancy: G5P1031 at [redacted]w[redacted]d  1. Supervision of high risk pregnancy, antepartum 2. Advanced maternal age, primigravida, antepartum -pt reports HA intermittently since last week.  Has not taken any tylenol to help.  Encouraged to try  Tylenol.   - B/P elevated- will have patient RTC  to check b/p in 3 days.  -U/A WNL today  -S/sx of preeclampsia discussed, denies those s/sx,  advised to let us know/to ER asap if present.  -anticipated guidance- IOL paper work at 39 weeks - Urinalysis (Urine Dip)    Term labor symptoms and general obstetric precautions including but not limited to vaginal bleeding, contractions, leaking of fluid and fetal movement were reviewed in detail with the patient. Please refer to After Visit Summary for other counseling recommendations.  Return in about 1 week (around 06/12/2021) for routine prenatal care.  Future Appointments  Date Time Provider Department Center  06/12/2021  2:00 PM [facility] PROVIDER [facility] None    Wendi Snipes, FNP

## 2021-06-05 NOTE — Progress Notes (Addendum)
Here today for 37.4 week MH RV. Taking PNV QD. Denies ED/hospital visits since last RV. Complains of headaches that started last week. States has increased fluid intake but has not took anything to assist with relieving headaches. Tawny Hopping, RN

## 2021-06-07 ENCOUNTER — Telehealth: Payer: Self-pay | Admitting: Family Medicine

## 2021-06-07 NOTE — Telephone Encounter (Signed)
Call to patient to follow up on HA, pt denies any HA since appointment.  Pt scheduled for visit for B/P check for 06/08/21 @ 8:20 am   Wendi Snipes, FNP

## 2021-06-08 ENCOUNTER — Other Ambulatory Visit: Payer: Self-pay

## 2021-06-08 ENCOUNTER — Ambulatory Visit: Payer: Medicaid Other | Admitting: Family Medicine

## 2021-06-08 VITALS — BP 133/85 | HR 84 | Temp 97.7°F | Wt 247.4 lb

## 2021-06-08 DIAGNOSIS — O0993 Supervision of high risk pregnancy, unspecified, third trimester: Secondary | ICD-10-CM

## 2021-06-08 LAB — URINALYSIS
Bilirubin, UA: NEGATIVE
Glucose, UA: NEGATIVE
Ketones, UA: NEGATIVE
Leukocytes,UA: NEGATIVE
Nitrite, UA: NEGATIVE
Protein,UA: NEGATIVE
RBC, UA: NEGATIVE
Specific Gravity, UA: 1.02 (ref 1.005–1.030)
Urobilinogen, Ur: 0.2 mg/dL (ref 0.2–1.0)
pH, UA: 5.5 (ref 5.0–7.5)

## 2021-06-08 NOTE — Progress Notes (Signed)
Aware of previously scheduled [facility] RV appt 06/12/2021. Jossie Ng, RN Per Elveria Rising FNP-BC, change appt to 06/11/2021. Appt scheduled and client provided reminder card. Jossie Ng, RN Urine dip reviewed by Elveria Rising FNP-BC (Dip normal). Jossie Ng, RN

## 2021-06-08 NOTE — Progress Notes (Signed)
   PRENATAL VISIT NOTE  Subjective:  Anne Harrington is a 35 y.o. [phone] at [redacted]w[redacted]d being seen today for ongoing prenatal care.  She is currently monitored for the following issues for this high-risk pregnancy and has History of migraine headaches; History of abnormal cervical Pap smear; History of asthma; Supervision of high risk pregnancy, antepartum; Obesity in pregnancy, antepartum; Advanced maternal age, primigravida, antepartum; Rh negative state in antepartum period; Abnormal blood findings; and Fetal renal anomaly, single gestation on their problem list.  Patient reports no complaints.  Contractions: Irritability. Vag. Bleeding: None.  Movement: Present. Denies leaking of fluid/ROM.   The following portions of the patient's history were reviewed and updated as appropriate: allergies, current medications, past family history, past medical history, past social history, past surgical history and problem list. Problem list updated.  Objective:   Vitals:   06/08/21 0810  BP: 133/85  Pulse: 84  Temp: 97.7 F (36.5 C)  Weight: 247 lb 6.4 oz (112.2 kg)    Fetal Status: Fetal Heart Rate (bpm): 147 Fundal Height: 39 cm Movement: Present  Presentation: Vertex  General:  Alert, oriented and cooperative. Patient is in no acute distress.  Skin: Skin is warm and dry. No rash noted.   Cardiovascular: Normal heart rate noted  Respiratory: Normal respiratory effort, no problems with respiration noted  Abdomen: Soft, gravid, appropriate for gestational age.  Pain/Pressure: Absent     Pelvic: Cervical exam performed Dilation: 1 Effacement (%): Thick Station: Ballotable  Extremities: Normal range of motion.  Edema: Trace  Mental Status: Normal mood and affect. Normal behavior. Normal judgment and thought content.   Assessment and Plan:  Pregnancy: G5P1031 at [redacted]w[redacted]d  1. High-risk pregnancy in third trimester Pt asked to RTC to follow up for elevated blood pressure at previous visit.   Previous  b/p was 133/85, reported HA the previous week but denied any other s/sx of preeclampsia. U/A had neg protein.    Today patient  b/p was 131/79 and patient denies - any s/sx, except trace edema. Fetal movement is as normal for patient, no decrease.   Urine was neg for protein.  -Pt stopped  81mg  aspirin daily at [redacted] wks gestation  -S/sx of preeclampsia discussed, advised to let know/to ER asap if present.  - Urinalysis (Urine Dip) - Protein / creatinine ratio, urine  (Spot)  Consulted with E. Scoria, CNM.  Consider sweeping Membranes at 38 weeks.  Discussed membrane sweeping today with patient. Reviewed cochrane review data on membrane sweeping. Reviewed risk of cramping, contractions, bleeding and ROM. Answered patient questions and she agreed to proceed with procedure.  This provider was unable to successfully sweep membranes today.    Term labor symptoms and general obstetric precautions including but not limited to vaginal bleeding, contractions, leaking of fluid and fetal movement were reviewed in detail with the patient. Please refer to After Visit Summary for other counseling recommendations.  No follow-ups on file.  Future Appointments  Date Time Provider Department Center  06/11/2021  9:00 AM [facility] PROVIDER [facility] None    06/13/2021, FNP

## 2021-06-09 LAB — PROTEIN / CREATININE RATIO, URINE
Creatinine, Urine: 67.9 mg/dL
Protein, Ur: 8.1 mg/dL
Protein/Creat Ratio: 119 mg/g creat (ref 0–200)

## 2021-06-11 ENCOUNTER — Ambulatory Visit: Payer: Medicaid Other | Admitting: Advanced Practice Midwife

## 2021-06-11 ENCOUNTER — Other Ambulatory Visit: Payer: Self-pay

## 2021-06-11 VITALS — BP 136/82 | HR 85 | Temp 98.1°F | Wt 251.0 lb

## 2021-06-11 DIAGNOSIS — O99210 Obesity complicating pregnancy, unspecified trimester: Secondary | ICD-10-CM

## 2021-06-11 DIAGNOSIS — Z8709 Personal history of other diseases of the respiratory system: Secondary | ICD-10-CM

## 2021-06-11 DIAGNOSIS — O0990 Supervision of high risk pregnancy, unspecified, unspecified trimester: Secondary | ICD-10-CM

## 2021-06-11 DIAGNOSIS — O09519 Supervision of elderly primigravida, unspecified trimester: Secondary | ICD-10-CM

## 2021-06-11 LAB — URINALYSIS
Bilirubin, UA: NEGATIVE
Glucose, UA: NEGATIVE
Ketones, UA: NEGATIVE
Leukocytes,UA: NEGATIVE
Nitrite, UA: NEGATIVE
Protein,UA: NEGATIVE
RBC, UA: NEGATIVE
Specific Gravity, UA: 1.02 (ref 1.005–1.030)
Urobilinogen, Ur: 0.2 mg/dL (ref 0.2–1.0)
pH, UA: 6 (ref 5.0–7.5)

## 2021-06-11 NOTE — Progress Notes (Signed)
   PRENATAL VISIT NOTE  Subjective:  Anne Harrington is a 35 y.o. [phone] at [redacted]w[redacted]d being seen today for ongoing prenatal care.  She is currently monitored for the following issues for this high-risk pregnancy and has History of migraine headaches; History of abnormal cervical Pap smear; History of asthma; Supervision of high risk pregnancy, antepartum; Obesity in pregnancy, antepartum; Advanced maternal age, primigravida, antepartum: 35 yo; Rh negative state in antepartum period; Abnormal blood findings; and Fetal renal anomaly, single gestation on their problem list.  Patient reports no complaints.  Contractions: Irritability. Vag. Bleeding: None.  Movement: Present. Denies leaking of fluid/ROM.   The following portions of the patient's history were reviewed and updated as appropriate: allergies, current medications, past family history, past medical history, past social history, past surgical history and problem list. Problem list updated.  Objective:   Vitals:   06/11/21 0840 06/11/21 0846  BP: 131/87 136/82  Pulse: (!) 108 85  Temp: 98.1 F (36.7 C)   Weight: 251 lb (113.9 kg)     Fetal Status: Fetal Heart Rate (bpm): 150 Fundal Height: 38 cm Movement: Present  Presentation: Vertex  General:  Alert, oriented and cooperative. Patient is in no acute distress.  Skin: Skin is warm and dry. No rash noted.   Cardiovascular: Normal heart rate noted  Respiratory: Normal respiratory effort, no problems with respiration noted  Abdomen: Soft, gravid, appropriate for gestational age.  Pain/Pressure: Absent     Pelvic: Cervical exam performed Dilation: 1 Effacement (%): Thick Station: -3  Extremities: Normal range of motion.  Edema: Mild pitting, slight indentation  Mental Status: Normal mood and affect. Normal behavior. Normal judgment and thought content.   Assessment and Plan:  Pregnancy: G5P1031 at [redacted]w[redacted]d  1. Supervision of high risk pregnancy, antepartum BP trending upwards.  Today=136/82, 1+ edema, 1 h/a this am since 06/08/21 that woke her from sleep (has not taken Tylenol or eaten breakfast yet). Pt agrees to strip membranes today.  Denies scotoma, epigastric pain. Working 30 hrs/wk. Has car seat and ready for baby at home. Knows when to go to L&D especially with danger signs of preeclampsia.  06/08/21 spot protein/creat ratio=119 and u/a neg proteinuria. RTC 06/15/21 - Urinalysis (Urine Dip)  2. Advanced maternal age, primigravida, antepartum: 35 yo   3. Obesity in pregnancy, antepartum 61 lb (27.7 kg) Stopped ASA 81 mg at 36 wks  4. History of asthma Denies sxs   Term labor symptoms and general obstetric precautions including but not limited to vaginal bleeding, contractions, leaking of fluid and fetal movement were reviewed in detail with the patient. Please refer to After Visit Summary for other counseling recommendations.  No follow-ups on file.  No future appointments.  Alberteen Spindle, CNM

## 2021-06-11 NOTE — Progress Notes (Signed)
Pt denies visits to ER since last appt with [facility]. Taking PNV and Omega 3 vitamins.

## 2021-06-11 NOTE — Progress Notes (Signed)
Urine dip WNL and reviewed by e. Sciora CNM. Jossie Ng, RN

## 2021-06-12 ENCOUNTER — Ambulatory Visit: Payer: Medicaid Other

## 2021-06-15 ENCOUNTER — Other Ambulatory Visit: Payer: Self-pay

## 2021-06-15 ENCOUNTER — Ambulatory Visit: Payer: Medicaid Other | Admitting: Advanced Practice Midwife

## 2021-06-15 VITALS — BP 128/60 | HR 104 | Temp 98.6°F | Wt 251.6 lb

## 2021-06-15 DIAGNOSIS — O99210 Obesity complicating pregnancy, unspecified trimester: Secondary | ICD-10-CM

## 2021-06-15 DIAGNOSIS — O0990 Supervision of high risk pregnancy, unspecified, unspecified trimester: Secondary | ICD-10-CM

## 2021-06-15 NOTE — Progress Notes (Addendum)
   PRENATAL VISIT NOTE  Subjective:  Anne Harrington is a 35 y.o. [phone] at [redacted]w[redacted]d being seen today for ongoing prenatal care.  She is currently monitored for the following issues for this high-risk pregnancy and has History of migraine headaches; History of abnormal cervical Pap smear; History of asthma; Supervision of high risk pregnancy, antepartum; Obesity in pregnancy, antepartum; Advanced maternal age, primigravida, antepartum: 35 yo; Rh negative state in antepartum period; Abnormal blood findings; and Fetal renal anomaly, single gestation on their problem list.  Patient reports no complaints.  Contractions: Not present. Vag. Bleeding: None.  Movement: Present. Denies leaking of fluid/ROM.   The following portions of the patient's history were reviewed and updated as appropriate: allergies, current medications, past family history, past medical history, past social history, past surgical history and problem list. Problem list updated.  Objective:   Vitals:   06/15/21 1447  BP: 128/60  Pulse: (!) 104  Temp: 98.6 F (37 C)  Weight: 251 lb 9.6 oz (114.1 kg)    Fetal Status: Fetal Heart Rate (bpm): 140 Fundal Height: 40 cm Movement: Present  Presentation: Vertex  General:  Alert, oriented and cooperative. Patient is in no acute distress.  Skin: Skin is warm and dry. No rash noted.   Cardiovascular: Normal heart rate noted  Respiratory: Normal respiratory effort, no problems with respiration noted  Abdomen: Soft, gravid, appropriate for gestational age.  Pain/Pressure: Absent     Pelvic: Cervical exam performed Dilation: 1 Effacement (%): 90 Station: -3  Extremities: Normal range of motion.  Edema: Mild pitting, slight indentation  Mental Status: Normal mood and affect. Normal behavior. Normal judgment and thought content.   Assessment and Plan:  Pregnancy: G5P1031 at [redacted]w[redacted]d  1. Obesity in pregnancy, antepartum 61 lb 9.6 oz (27.9 kg)   2. Supervision of high risk pregnancy,  antepartum Today is last day of work 128/60, denies h/a since last apt, 1+ edema 06/08/21 neg proteinurioa and spot/protein creat=119. Stripped membranes again today; anterior cx, soft, 90%/loose 1 cm/vtx -3 IOL paperwork completed and pt desires 06/29/21 Has car seat; knows when to go to L&D   Term labor symptoms and general obstetric precautions including but not limited to vaginal bleeding, contractions, leaking of fluid and fetal movement were reviewed in detail with the patient. Please refer to After Visit Summary for other counseling recommendations.  Return in about 1 week (around 06/22/2021) for routine PNC.  No future appointments.  Alberteen Spindle, CNM

## 2021-06-15 NOTE — Progress Notes (Signed)
IOL form received today by provider Hazle Coca, CNM.  IOL form faxed to [facility] today with confirmation received.   Floy Sabina, RN

## 2021-06-18 ENCOUNTER — Other Ambulatory Visit: Payer: Self-pay

## 2021-06-18 DIAGNOSIS — O480 Post-term pregnancy: Secondary | ICD-10-CM

## 2021-06-18 NOTE — Progress Notes (Signed)
[id number] with postdates pregnancy at [redacted]w[redacted]d LMP of 09/15/2020, c/w early [place] 23w[place].  Scheduled for induction of labor for postdates pregnancy on 06/29/2021 at 0800  Prenatal provider: [facility] Pregnancy complicated by: AMA Rh neg - Rhogam given 04/02/2021 Obesity in pregnancy  History of asthma  Fetal bilateral chorioid plexus cyst - resolved   Prenatal Labs: Blood type/Rh O neg - Rhogam given 04/02/2021  Antibody screen neg  Rubella MMR x 1 - rubella pending   Varicella Immune  RPR NR  HBsAg Neg  HIV NR  GC neg  Chlamydia neg  Genetic screening negative  1 hour GTT 112  3 hour GTT neg  GBS neg   Tdap: given 04/02/2021 Flu: declined  Contraception: Skyla versus liletta  Feeding preference: Breast   ____ ADrinda Butts CNM Certified Nurse Midwife [facility]

## 2021-06-19 ENCOUNTER — Telehealth: Payer: Self-pay

## 2021-06-19 NOTE — Telephone Encounter (Signed)
Call to [facility] to ascertain if IOL has been scheduled. Per receptionist, they have sent request to L & D for 06/29/2021, but have not yet had confirmation of that date of IOL. Jossie Ng, RN

## 2021-06-21 ENCOUNTER — Telehealth: Payer: Self-pay

## 2021-06-21 NOTE — Telephone Encounter (Signed)
Call to [facility] to ascertain if IOL has been scheduled.  Per receptionist, she will return the call when they hear back from the provider, Ana.  Floy Sabina, RN

## 2021-06-21 NOTE — Telephone Encounter (Signed)
TC to [facility] to ask about IOL date. Per reception, the provider who reviewed patient's record is in L&D currently, and IOL date not available. TC to [facility] L&D, patient scheduled for IOL on 06/29/2021 at 8am. Patient to enter through ED.Marland KitchenBurt Knack, RN

## 2021-06-21 NOTE — Telephone Encounter (Signed)
Contacted patient to let her know of IOL date on 06/29/21 for 0800 arrival. Informed patient to arrive at [facility] ED for registration first.   Patient verbalized understanding.   Floy Sabina, RN

## 2021-06-22 ENCOUNTER — Encounter: Payer: Self-pay | Admitting: Obstetrics and Gynecology

## 2021-06-22 ENCOUNTER — Other Ambulatory Visit: Payer: Self-pay

## 2021-06-22 ENCOUNTER — Inpatient Hospital Stay
Admission: EM | Admit: 2021-06-22 | Discharge: 2021-06-24 | DRG: 806 | Disposition: A | Discharge status: Home / Self Care | Payer: Medicaid Other

## 2021-06-22 ENCOUNTER — Ambulatory Visit: Payer: Medicaid Other | Admitting: Family Medicine

## 2021-06-22 ENCOUNTER — Inpatient Hospital Stay: Payer: Medicaid Other | Admitting: Anesthesiology

## 2021-06-22 VITALS — BP 147/98 | HR 101 | Temp 98.5°F | Wt 253.6 lb

## 2021-06-22 DIAGNOSIS — O99214 Obesity complicating childbirth: Secondary | ICD-10-CM | POA: Diagnosis present

## 2021-06-22 DIAGNOSIS — Z87891 Personal history of nicotine dependence: Secondary | ICD-10-CM | POA: Diagnosis not present

## 2021-06-22 DIAGNOSIS — O9081 Anemia of the puerperium: Secondary | ICD-10-CM | POA: Diagnosis not present

## 2021-06-22 DIAGNOSIS — O0990 Supervision of high risk pregnancy, unspecified, unspecified trimester: Secondary | ICD-10-CM

## 2021-06-22 DIAGNOSIS — O358XX0 Maternal care for other (suspected) fetal abnormality and damage, not applicable or unspecified: Secondary | ICD-10-CM | POA: Diagnosis present

## 2021-06-22 DIAGNOSIS — O0993 Supervision of high risk pregnancy, unspecified, third trimester: Secondary | ICD-10-CM

## 2021-06-22 DIAGNOSIS — D62 Acute posthemorrhagic anemia: Secondary | ICD-10-CM | POA: Diagnosis not present

## 2021-06-22 DIAGNOSIS — O99210 Obesity complicating pregnancy, unspecified trimester: Secondary | ICD-10-CM

## 2021-06-22 DIAGNOSIS — O139 Gestational [pregnancy-induced] hypertension without significant proteinuria, unspecified trimester: Secondary | ICD-10-CM | POA: Diagnosis present

## 2021-06-22 DIAGNOSIS — Z6791 Unspecified blood type, Rh negative: Secondary | ICD-10-CM | POA: Diagnosis not present

## 2021-06-22 DIAGNOSIS — O26893 Other specified pregnancy related conditions, third trimester: Secondary | ICD-10-CM | POA: Diagnosis present

## 2021-06-22 DIAGNOSIS — O09519 Supervision of elderly primigravida, unspecified trimester: Secondary | ICD-10-CM

## 2021-06-22 DIAGNOSIS — R030 Elevated blood-pressure reading, without diagnosis of hypertension: Secondary | ICD-10-CM | POA: Diagnosis present

## 2021-06-22 DIAGNOSIS — Z3A40 40 weeks gestation of pregnancy: Secondary | ICD-10-CM

## 2021-06-22 DIAGNOSIS — O480 Post-term pregnancy: Secondary | ICD-10-CM

## 2021-06-22 DIAGNOSIS — Z20822 Contact with and (suspected) exposure to covid-19: Secondary | ICD-10-CM | POA: Diagnosis present

## 2021-06-22 DIAGNOSIS — O134 Gestational [pregnancy-induced] hypertension without significant proteinuria, complicating childbirth: Secondary | ICD-10-CM | POA: Diagnosis present

## 2021-06-22 DIAGNOSIS — R799 Abnormal finding of blood chemistry, unspecified: Secondary | ICD-10-CM

## 2021-06-22 DIAGNOSIS — O35EXX0 Maternal care for other (suspected) fetal abnormality and damage, fetal genitourinary anomalies, not applicable or unspecified: Secondary | ICD-10-CM

## 2021-06-22 LAB — PROTEIN / CREATININE RATIO, URINE
Creatinine, Urine: 91 mg/dL
Protein Creatinine Ratio: 0.07 mg/mg{creat} (ref 0.00–0.15)
Total Protein, Urine: 6 mg/dL

## 2021-06-22 LAB — COMPREHENSIVE METABOLIC PANEL WITH GFR
ALT: 13 U/L (ref 0–44)
AST: 19 U/L (ref 15–41)
Albumin: 2.7 g/dL — ABNORMAL LOW (ref 3.5–5.0)
Alkaline Phosphatase: 96 U/L (ref 38–126)
Anion gap: 7 (ref 5–15)
BUN: 8 mg/dL (ref 6–20)
CO2: 21 mmol/L — ABNORMAL LOW (ref 22–32)
Calcium: 8.4 mg/dL — ABNORMAL LOW (ref 8.9–10.3)
Chloride: 107 mmol/L (ref 98–111)
Creatinine, Ser: 0.67 mg/dL (ref 0.44–1.00)
GFR, Estimated: >60 mL/min (ref >60)
Glucose, Bld: 89 mg/dL (ref 70–99)
Potassium: 3.9 mmol/L (ref 3.5–5.1)
Sodium: 135 mmol/L (ref 135–145)
Total Bilirubin: 0.5 mg/dL (ref 0.3–1.2)
Total Protein: 6.3 g/dL — ABNORMAL LOW (ref 6.5–8.1)

## 2021-06-22 LAB — CBC
HCT: 32.2 % — ABNORMAL LOW (ref 36.0–46.0)
Hemoglobin: 11.2 g/dL — ABNORMAL LOW (ref 12.0–15.0)
MCH: 31.5 pg (ref 26.0–34.0)
MCHC: 34.8 g/dL (ref 30.0–36.0)
MCV: 90.7 fL (ref 80.0–100.0)
Platelets: 214 K/uL (ref 150–400)
RBC: 3.55 MIL/uL — ABNORMAL LOW (ref 3.87–5.11)
RDW: 13.1 % (ref 11.5–15.5)
WBC: 11.9 K/uL — ABNORMAL HIGH (ref 4.0–10.5)
nRBC: 0 % (ref 0.0–0.2)

## 2021-06-22 LAB — URINALYSIS
Bilirubin, UA: NEGATIVE
Glucose, UA: NEGATIVE
Ketones, UA: NEGATIVE
Leukocytes,UA: NEGATIVE
Nitrite, UA: NEGATIVE
Protein,UA: NEGATIVE
RBC, UA: NEGATIVE
Specific Gravity, UA: 1.025 (ref 1.005–1.030)
Urobilinogen, Ur: 0.2 mg/dL (ref 0.2–1.0)
pH, UA: 5.5 (ref 5.0–7.5)

## 2021-06-22 LAB — RESP PANEL BY RT-PCR (FLU A&B, COVID) ARPGX2
Influenza A by PCR: NEGATIVE
Influenza B by PCR: NEGATIVE
SARS Coronavirus 2 by RT PCR: NEGATIVE

## 2021-06-22 MED ORDER — LIDOCAINE HCL (PF) 1 % IJ SOLN
INTRAMUSCULAR | Status: AC
  Filled 2021-06-22: qty 30

## 2021-06-22 MED ORDER — ONDANSETRON HCL 4 MG/2ML IJ SOLN: 4.0000 mg | Freq: Four times a day (QID) | INTRAMUSCULAR | Status: DC | PRN

## 2021-06-22 MED ORDER — LACTATED RINGERS IV SOLN
500.0000 mL | Freq: Once | INTRAVENOUS | Status: AC
  Administered 2021-06-22: 500 mL via INTRAVENOUS

## 2021-06-22 MED ORDER — ACETAMINOPHEN 500 MG PO TABS
1000.0000 mg | ORAL_TABLET | Freq: Four times a day (QID) | ORAL | Status: DC | PRN
  Administered 2021-06-22: 1000 mg via ORAL
  Filled 2021-06-22: qty 2

## 2021-06-22 MED ORDER — CALCIUM CARBONATE ANTACID 500 MG PO CHEW: 2.0000 | CHEWABLE_TABLET | ORAL | Status: DC | PRN

## 2021-06-22 MED ORDER — FENTANYL 2.5 MCG/ML W/ROPIVACAINE 0.15% IN NS 100 ML EPIDURAL (ARMC)
12.0000 mL/h | EPIDURAL | Status: DC
  Administered 2021-06-22: 12 mL/h via EPIDURAL

## 2021-06-22 MED ORDER — DIPHENHYDRAMINE HCL 50 MG/ML IJ SOLN: 12.5000 mg | INTRAMUSCULAR | Status: DC | PRN

## 2021-06-22 MED ORDER — FENTANYL 2.5 MCG/ML W/ROPIVACAINE 0.15% IN NS 100 ML EPIDURAL (ARMC)
EPIDURAL | Status: AC
  Filled 2021-06-22: qty 100

## 2021-06-22 MED ORDER — PHENYLEPHRINE 40 MCG/ML (10ML) SYRINGE FOR IV PUSH (FOR BLOOD PRESSURE SUPPORT): 80.0000 ug | PREFILLED_SYRINGE | INTRAVENOUS | Status: DC | PRN

## 2021-06-22 MED ORDER — LACTATED RINGERS IV SOLN: 500.0000 mL | INTRAVENOUS | Status: DC | PRN

## 2021-06-22 MED ORDER — LIDOCAINE HCL (PF) 1 % IJ SOLN
INTRAMUSCULAR | Status: DC | PRN
  Administered 2021-06-22: 3 mL via SUBCUTANEOUS

## 2021-06-22 MED ORDER — BUPIVACAINE HCL (PF) 0.25 % IJ SOLN
INTRAMUSCULAR | Status: DC | PRN
  Administered 2021-06-22 (×2): 4 mL via EPIDURAL

## 2021-06-22 MED ORDER — EPHEDRINE 5 MG/ML INJ: 10.0000 mg | INTRAVENOUS | Status: DC | PRN

## 2021-06-22 MED ORDER — LIDOCAINE-EPINEPHRINE (PF) 1.5 %-1:200000 IJ SOLN
INTRAMUSCULAR | Status: DC | PRN
  Administered 2021-06-22: 4 mL via EPIDURAL

## 2021-06-22 MED ORDER — OXYTOCIN BOLUS FROM INFUSION
333.0000 mL | Freq: Once | INTRAVENOUS | Status: AC
  Administered 2021-06-23: 333 mL via INTRAVENOUS

## 2021-06-22 MED ORDER — TERBUTALINE SULFATE 1 MG/ML IJ SOLN: 0.2500 mg | Freq: Once | INTRAMUSCULAR | Status: DC | PRN

## 2021-06-22 MED ORDER — OXYTOCIN-SODIUM CHLORIDE 30-0.9 UT/500ML-% IV SOLN
2.5000 [IU]/h | INTRAVENOUS | Status: DC
  Filled 2021-06-22: qty 500

## 2021-06-22 MED ORDER — CALCIUM CARBONATE ANTACID 500 MG PO CHEW: 400.0000 mg | CHEWABLE_TABLET | Freq: Three times a day (TID) | ORAL | Status: DC | PRN

## 2021-06-22 MED ORDER — ACETAMINOPHEN 500 MG PO TABS: 1000.0000 mg | ORAL_TABLET | Freq: Four times a day (QID) | ORAL | Status: DC | PRN

## 2021-06-22 MED ORDER — SOD CITRATE-CITRIC ACID 500-334 MG/5ML PO SOLN: 30.0000 mL | ORAL | Status: DC | PRN

## 2021-06-22 MED ORDER — LIDOCAINE HCL (PF) 1 % IJ SOLN: 30.0000 mL | INTRAMUSCULAR | Status: DC | PRN

## 2021-06-22 MED ORDER — MISOPROSTOL 25 MCG QUARTER TABLET
25.0000 ug | ORAL_TABLET | ORAL | Status: DC | PRN
  Administered 2021-06-22: 25 ug via VAGINAL
  Filled 2021-06-22: qty 1

## 2021-06-22 MED ORDER — OXYTOCIN-SODIUM CHLORIDE 30-0.9 UT/500ML-% IV SOLN
1.0000 m[IU]/min | INTRAVENOUS | Status: DC
  Administered 2021-06-22: 2 m[IU]/min via INTRAVENOUS

## 2021-06-22 MED ORDER — FENTANYL CITRATE (PF) 100 MCG/2ML IJ SOLN: 50.0000 ug | INTRAMUSCULAR | Status: DC | PRN

## 2021-06-22 MED ORDER — LACTATED RINGERS IV SOLN: INTRAVENOUS | Status: DC

## 2021-06-22 MED ORDER — MISOPROSTOL 200 MCG PO TABS
ORAL_TABLET | ORAL | Status: AC
  Filled 2021-06-22: qty 4

## 2021-06-22 MED ORDER — AMMONIA AROMATIC IN INHA
RESPIRATORY_TRACT | Status: AC
  Filled 2021-06-22: qty 10

## 2021-06-22 MED ORDER — MAGNESIUM OXIDE -MG SUPPLEMENT 400 (240 MG) MG PO TABS
800.0000 mg | ORAL_TABLET | Freq: Once | ORAL | Status: AC
  Administered 2021-06-22: 400 mg via ORAL
  Filled 2021-06-22: qty 2

## 2021-06-22 MED ORDER — OXYTOCIN 10 UNIT/ML IJ SOLN
INTRAMUSCULAR | Status: AC
  Filled 2021-06-22: qty 2

## 2021-06-22 NOTE — H&P (Signed)
OB History & Physical   History of Present Illness:   Chief Complaint: sent from [facility] d/t elevated b/p's   HPI:  Anne Harrington is a 35 y.o. [id number] female at [redacted]w[redacted]d dated by LMP of 09/15/2020, c/w [place] at [redacted]w[redacted]d.  She presents to L&D for elevated blood pressures in clinic today.  Anne Harrington was seen for her routine prenatal visit today and had several mild range blood pressures.  She reports a mild headache and woke up this morning with blurred vision.  The visual changes resolved without intervention.  Denies RUQ pain.  Reports irregular contractions, denies LOF, and vaginal bleeding.  Endorses good fetal movement.    Reports active fetal movement  Contractions: irregular cramping  LOF/SROM: denies  Vaginal bleeding: denies   Pregnancy Issues:  Patient Active Problem List   Diagnosis Date Noted   Indication for care in labor or delivery 06/22/2021   Gestational hypertension 06/22/2021   Fetal renal anomaly, single gestation 01/26/2021   Abnormal blood findings 12/11/2020   Rh negative state in antepartum period 12/08/2020   Supervision of high risk pregnancy, antepartum 12/07/2020   Obesity in pregnancy, antepartum 12/07/2020   Advanced maternal age, primigravida, antepartum: 35 yo 12/07/2020   History of migraine headaches 09/26/2016   History of abnormal cervical Pap smear 09/26/2016   Cervical dysplasia 07/05/2015   History of asthma 09/13/2014     Maternal Medical History:   Past Medical History:  Diagnosis Date   Anxiety    Asthma    H/O LEEP 2016   Vaginal Pap smear, abnormal    2014 and 2015. [facility]    Past Surgical History:  Procedure Laterality Date   COLPOSCOPY     EYE SURGERY     blocked tear ducts at 17 months   LEEP      Allergies  Allergen Reactions   Latex Itching    Prior to Admission medications   Medication Sig Start Date End Date Taking? Authorizing Provider  acetaminophen (TYLENOL) 325 MG tablet Take 325 mg by mouth every 6 (six) hours as  needed.   Yes [provider]  Omega-3 Fatty Acids (OMEGA 3 PO) Take 2,500 mg by mouth daily. (2.5 gram packet)   Yes [provider]  Prenatal Vit-Fe Fumarate-FA (PRENATAL VITAMIN PO) Take 2 tablets by mouth 1 day or 1 dose. 2 gummies per day   Yes [provider]     Prenatal care site:  [facility]  Social History: She  reports that she has quit smoking. She has never used smokeless tobacco. She reports previous alcohol use. She reports previous drug use. Drug: Marijuana.  Family History: family history includes ADD / ADHD in her son; Breast cancer in her paternal grandmother; Cancer in her paternal grandfather; Diabetes in her paternal grandmother; Heart disease in her maternal grandfather; Hypertension in her father and maternal grandmother; Migraines in her mother; Spina bifida in her sister; Stroke in her maternal grandmother.   Review of Systems: A full review of systems was performed and negative except as noted in the HPI.     Physical Exam:  Vital Signs: BP 128/84 (BP Location: Right Arm)   Pulse 79   Temp 98.9 F (37.2 C) (Oral)   Resp 18   LMP 09/15/2020 Comment: normal   General: no acute distress.  HEENT: normocephalic, atraumatic Heart: regular rate & rhythm.  No murmurs/rubs/gallops Lungs: clear to auscultation bilaterally, normal respiratory effort Abdomen: soft, gravid, non-tender;  EFW: 8lbs  Pelvic:  External: Normal external female genitalia  Cervix: Dilation: 1 / Effacement (%): 70 / Station: -2    Extremities: non-tender, symmetric, No edema bilaterally.  DTRs: 2+/2+  Neurologic: Alert & oriented x 3.    Results for orders placed or performed during the hospital encounter of 06/22/21 (from the past 24 hour(s))  Protein / creatinine ratio, urine     Status: None   Collection Time: 06/22/21  1:44 PM  Result Value Ref Range   Creatinine, Urine 91 mg/dL   Total Protein, Urine 6 mg/dL   Protein Creatinine Ratio 0.07 0.00 - 0.15  mg/mg[Cre]  CBC on admission     Status: Abnormal   Collection Time: 06/22/21  2:04 PM  Result Value Ref Range   WBC 11.9 (H) 4.0 - 10.5 K/uL   RBC 3.55 (L) 3.87 - 5.11 MIL/uL   Hemoglobin 11.2 (L) 12.0 - 15.0 g/dL   HCT 47.8 (L) 29.5 - 62.1 %   MCV 90.7 80.0 - 100.0 fL   MCH 31.5 26.0 - 34.0 pg   MCHC 34.8 30.0 - 36.0 g/dL   RDW 30.8 65.7 - 84.6 %   Platelets 214 150 - 400 K/uL   nRBC 0.0 0.0 - 0.2 %  Comprehensive metabolic panel     Status: Abnormal   Collection Time: 06/22/21  2:04 PM  Result Value Ref Range   Sodium 135 135 - 145 mmol/L   Potassium 3.9 3.5 - 5.1 mmol/L   Chloride 107 98 - 111 mmol/L   CO2 21 (L) 22 - 32 mmol/L   Glucose, Bld 89 70 - 99 mg/dL   BUN 8 6 - 20 mg/dL   Creatinine, Ser 9.62 0.44 - 1.00 mg/dL   Calcium 8.4 (L) 8.9 - 10.3 mg/dL   Total Protein 6.3 (L) 6.5 - 8.1 g/dL   Albumin 2.7 (L) 3.5 - 5.0 g/dL   AST 19 15 - 41 U/L   ALT 13 0 - 44 U/L   Alkaline Phosphatase 96 38 - 126 U/L   Total Bilirubin 0.5 0.3 - 1.2 mg/dL   GFR, Estimated >95 >28 mL/min   Anion gap 7 5 - 15  Type and screen     Status: None (Preliminary result)   Collection Time: 06/22/21  3:45 PM  Result Value Ref Range   ABO/RH(D) PENDING    Antibody Screen PENDING    Sample Expiration      06/25/2021,2359 Performed at [facility], [address] [place], [place] [address]   Resp Panel by RT-PCR (Flu A&B, Covid) Nasopharyngeal Swab     Status: None   Collection Time: 06/22/21  4:05 PM   Specimen: Nasopharyngeal Swab; Nasopharyngeal(NP) swabs in vial transport medium  Result Value Ref Range   SARS Coronavirus 2 by RT PCR NEGATIVE NEGATIVE   Influenza A by PCR NEGATIVE NEGATIVE   Influenza B by PCR NEGATIVE NEGATIVE  Type and screen     Status: None (Preliminary result)   Collection Time: 06/22/21  4:57 PM  Result Value Ref Range   ABO/RH(D) O NEG    Antibody Screen POS    Sample Expiration      06/25/2021,2359 Performed at [facility], [address] [place], [place] [address]    Antibody Identification PENDING     Pertinent Results:  Prenatal Labs: Blood type/Rh O neg, Rhogam given 04/02/2021  Antibody screen neg  Rubella Pending   Varicella Immune  RPR NR  HBsAg Neg  HIV NR  GC neg  Chlamydia neg  Genetic screening cfDNA negative, AFP neg  1 hour GTT 112  3 hour GTT N/A   GBS Negative    FHT:  FHR: 140 bpm, variability: moderate,  accelerations:  Present,  decelerations:  Absent Category/reactivity:  Category I UC:   irregular    Cephalic by Leopolds and SVE   No results found.  Assessment:  Anne Harrington is a 35 y.o. [phone] female at [redacted]w[redacted]d with gestational hypertension.   Plan:  1. Admit to Labor & Delivery; consents reviewed and obtained - Covid admission screen   2. Fetal Well being  - Fetal Tracing: cat 1 - Group B Streptococcus ppx not indicated: GBS neg - Presentation: cephalic confirmed by SVE   3. Routine OB: - Prenatal labs reviewed, as above - Rh neg - CBC, T&S, RPR on admit - Reg diet, saline lock  4. Induction of labor  - Contractions monitored with external toco - Pelvis proven to 7lbs 14 oz  - Plan for induction with misoprostol  - Induction with oxytocin, AROM, and cervical balloon as appropriate  - Plan for  continuous fetal monitoring - Maternal pain control as desired; planning regional anesthesia - Anticipate vaginal delivery  5. Post Partum Planning: - Infant feeding: Breast  - Contraception: Skyla versus Liletta  - Tdap vaccine: given 04/02/2021 - Flu vaccine: declined   Gustavo Lah, CNM 06/22/21 6:49 PM  Margaretmary Eddy, CNM Certified Nurse Midwife [facility]

## 2021-06-22 NOTE — Anesthesia Procedure Notes (Signed)
Epidural Patient location during procedure: OB  Staffing Performed: anesthesiologist   Preanesthetic Checklist Completed: patient identified, IV checked, site marked, risks and benefits discussed, surgical consent, monitors and equipment checked, pre-op evaluation and timeout performed  Epidural Patient position: sitting Prep: Betadine Patient monitoring: heart rate, continuous pulse ox and blood pressure Approach: midline Location: L4-L5 Injection technique: LOR saline  Needle:  Needle type: Tuohy  Needle gauge: 18 G Needle length: 9 cm and 9 Needle insertion depth: 6 cm Catheter type: closed end flexible Catheter size: 20 Guage Catheter at skin depth: 12 cm Test dose: negative and 1.5% lidocaine with Epi 1:200 K  Assessment Sensory level: T10 Events: blood not aspirated, injection not painful, no injection resistance, no paresthesia and negative IV test  Additional Notes   Patient tolerated the insertion well without complications.-SATD -IVTD. No paresthesia. Refer to [facility] nursing for VS and dosingReason for block:procedure for pain

## 2021-06-22 NOTE — Anesthesia Preprocedure Evaluation (Signed)
Anesthesia Evaluation  Patient identified by MRN, date of birth, ID band Patient awake    Reviewed: Allergy & Precautions, H&P , NPO status , Patient's Chart, lab work & pertinent test results, reviewed documented beta blocker date and time   Airway Mallampati: II  TM Distance: >3 FB Neck ROM: full    Dental no notable dental hx. (+) Teeth Intact   Pulmonary asthma , Current Smoker, former smoker,    Pulmonary exam normal breath sounds clear to auscultation       Cardiovascular Exercise Tolerance: Good hypertension,  Rhythm:regular Rate:Normal     Neuro/Psych Anxiety negative neurological ROS  negative psych ROS   GI/Hepatic negative GI ROS, Neg liver ROS,   Endo/Other  negative endocrine ROSdiabetes  Renal/GU Renal disease     Musculoskeletal   Abdominal   Peds  Hematology negative hematology ROS (+)   Anesthesia Other Findings   Reproductive/Obstetrics (+) Pregnancy                             Anesthesia Physical Anesthesia Plan  ASA: 2  Anesthesia Plan: Epidural   Post-op Pain Management:    Induction:   PONV Risk Score and Plan:   Airway Management Planned:   Additional Equipment:   Intra-op Plan:   Post-operative Plan:   Informed Consent: I have reviewed the patients History and Physical, chart, labs and discussed the procedure including the risks, benefits and alternatives for the proposed anesthesia with the patient or authorized representative who has indicated his/her understanding and acceptance.       Plan Discussed with:   Anesthesia Plan Comments:         Anesthesia Quick Evaluation

## 2021-06-22 NOTE — Progress Notes (Signed)
Patient here for MH RV at 40w 0/7 days.   Patient complaining of headache since last night (06/21/21) unrelieved by taking tylenol. Reports blurry vision upon waking up this morning but has resolved now. Patent denies increase swelling. Reports positive fetal movements. Denies epigastric pain.  Urine dip reviewed with proivder Elveria Rising, FNP during clinic visit.   Floy Sabina, RN

## 2021-06-22 NOTE — Progress Notes (Signed)
   PRENATAL VISIT NOTE  Subjective:  Anne Harrington is a 35 y.o. [phone] at 100w0d being seen today for ongoing prenatal care.  She is currently monitored for the following issues for this high-risk pregnancy and has History of migraine headaches; History of abnormal cervical Pap smear; History of asthma; Supervision of high risk pregnancy, antepartum; Obesity in pregnancy, antepartum; Advanced maternal age, primigravida, antepartum: 35 yo; Rh negative state in antepartum period; Abnormal blood findings; Fetal renal anomaly, single gestation; and Cervical dysplasia on their problem list.  Patient reports headache and occasional contractions.  Contractions: Irregular. Vag. Bleeding: None.  Movement: Present. Denies leaking of fluid/ROM.   The following portions of the patient's history were reviewed and updated as appropriate: allergies, current medications, past family history, past medical history, past social history, past surgical history and problem list. Problem list updated.  Objective:   Vitals:   06/22/21 1040 06/22/21 1108 06/22/21 1150  BP: (!) 148/87 139/86 (!) 147/98  Pulse: 96 91 (!) 101  Temp: 98.5 F (36.9 C)    Weight: 253 lb 9.6 oz (115 kg)      Fetal Status: Fetal Heart Rate (bpm): 151 Fundal Height: 41 cm Movement: Present  Presentation: Vertex  General:  Alert, oriented and cooperative. Patient is in no acute distress.  Skin: Skin is warm and dry. No rash noted.   Cardiovascular: Normal heart rate noted  Respiratory: Normal respiratory effort, no problems with respiration noted  Abdomen: Soft, gravid, appropriate for gestational age.  Pain/Pressure: Absent     Pelvic: Cervical exam deferred        Extremities: Normal range of motion.  Edema: Trace  Mental Status: Normal mood and affect. Normal behavior. Normal judgment and thought content.   Assessment and Plan:  Pregnancy: G5P1031 at [redacted]w[redacted]d  1. Supervision of high risk pregnancy, antepartum 2. Obesity in  pregnancy, antepartum 3. Post-term pregnancy, 40-42 weeks of gestation 4. Advanced maternal age, primigravida, antepartum: 35 yo  HA that started at ~ 11 pm last night, tylenol taken at 2:30 am with no relief.   Blurry vision upon waking now resolved,  denies  seeing spots or stars, upper abdominal pain, N/V -Urine WNL - neg protein    Call to [facility] on call, spoke/report given  to A. Tami Lin, CNM.  pt sent to  [facility] for preeclampsia work up.    Patient scheduled to RTC for 06/25/21 for follow up if have not delivered.    Pt aware of 06/29/21 am IOL appointment   - Urinalysis (Urine Dip)  Term labor symptoms and general obstetric precautions including but not limited to vaginal bleeding, contractions, leaking of fluid and fetal movement were reviewed in detail with the patient. Please refer to After Visit Summary for other counseling recommendations.  No follow-ups on file.  Future Appointments  Date Time Provider Department Center  06/25/2021  1:40 PM [facility] PROVIDER [facility] None    Wendi Snipes, FNP

## 2021-06-23 ENCOUNTER — Encounter: Payer: Self-pay | Admitting: Obstetrics and Gynecology

## 2021-06-23 LAB — CBC
HCT: 29.3 % — ABNORMAL LOW (ref 36.0–46.0)
Hemoglobin: 10 g/dL — ABNORMAL LOW (ref 12.0–15.0)
MCH: 31.5 pg (ref 26.0–34.0)
MCHC: 34.1 g/dL (ref 30.0–36.0)
MCV: 92.4 fL (ref 80.0–100.0)
Platelets: 172 10*3/uL (ref 150–400)
RBC: 3.17 MIL/uL — ABNORMAL LOW (ref 3.87–5.11)
RDW: 13 % (ref 11.5–15.5)
WBC: 14.8 10*3/uL — ABNORMAL HIGH (ref 4.0–10.5)
nRBC: 0 % (ref 0.0–0.2)

## 2021-06-23 LAB — RUBELLA SCREEN: Rubella: 1.02 index (ref >0.99)

## 2021-06-23 LAB — ABO/RH: ABO/RH(D): O NEG

## 2021-06-23 LAB — TYPE AND SCREEN
ABO/RH(D): O NEG
Antibody Screen: POSITIVE

## 2021-06-23 LAB — RPR: RPR Ser Ql: NONREACTIVE

## 2021-06-23 MED ORDER — DOCUSATE SODIUM 100 MG PO CAPS
100.0000 mg | ORAL_CAPSULE | Freq: Two times a day (BID) | ORAL | Status: DC
  Administered 2021-06-24 (×2): 100 mg via ORAL
  Filled 2021-06-23 (×2): qty 1

## 2021-06-23 MED ORDER — DIBUCAINE (PERIANAL) 1 % EX OINT: 1.0000 "application " | TOPICAL_OINTMENT | CUTANEOUS | Status: DC | PRN

## 2021-06-23 MED ORDER — FERROUS SULFATE 325 (65 FE) MG PO TABS
325.0000 mg | ORAL_TABLET | Freq: Two times a day (BID) | ORAL | Status: DC
  Administered 2021-06-23 – 2021-06-24 (×2): 325 mg via ORAL
  Filled 2021-06-23 (×2): qty 1

## 2021-06-23 MED ORDER — IBUPROFEN 600 MG PO TABS
600.0000 mg | ORAL_TABLET | Freq: Four times a day (QID) | ORAL | Status: DC
  Administered 2021-06-23 – 2021-06-24 (×4): 600 mg via ORAL
  Filled 2021-06-23 (×4): qty 1

## 2021-06-23 MED ORDER — ONDANSETRON HCL 4 MG PO TABS: 4.0000 mg | ORAL_TABLET | ORAL | Status: DC | PRN

## 2021-06-23 MED ORDER — PRENATAL MULTIVITAMIN CH
1.0000 | ORAL_TABLET | Freq: Every day | ORAL | Status: DC
  Administered 2021-06-23: 1 via ORAL
  Filled 2021-06-23: qty 1

## 2021-06-23 MED ORDER — COCONUT OIL OIL
1.0000 "application " | TOPICAL_OIL | Status: DC | PRN
  Administered 2021-06-23: 1 via TOPICAL
  Filled 2021-06-23: qty 120

## 2021-06-23 MED ORDER — SIMETHICONE 80 MG PO CHEW: 80.0000 mg | CHEWABLE_TABLET | ORAL | Status: DC | PRN

## 2021-06-23 MED ORDER — NIFEDIPINE ER OSMOTIC RELEASE 30 MG PO TB24
30.0000 mg | ORAL_TABLET | Freq: Every day | ORAL | Status: DC
  Administered 2021-06-23 – 2021-06-24 (×2): 30 mg via ORAL
  Filled 2021-06-23 (×2): qty 1

## 2021-06-23 MED ORDER — ACETAMINOPHEN 500 MG PO TABS
1000.0000 mg | ORAL_TABLET | Freq: Four times a day (QID) | ORAL | Status: DC | PRN
  Administered 2021-06-23 – 2021-06-24 (×2): 1000 mg via ORAL
  Filled 2021-06-23 (×2): qty 2

## 2021-06-23 MED ORDER — DIPHENHYDRAMINE HCL 25 MG PO CAPS
25.0000 mg | ORAL_CAPSULE | Freq: Four times a day (QID) | ORAL | Status: DC | PRN
Start: 2021-06-23 — End: 2021-06-24

## 2021-06-23 MED ORDER — BENZOCAINE-MENTHOL 20-0.5 % EX AERO
1.0000 "application " | INHALATION_SPRAY | CUTANEOUS | Status: DC | PRN
  Filled 2021-06-23: qty 56

## 2021-06-23 MED ORDER — WITCH HAZEL-GLYCERIN EX PADS: 1.0000 "application " | MEDICATED_PAD | CUTANEOUS | Status: DC

## 2021-06-23 MED ORDER — ONDANSETRON HCL 4 MG/2ML IJ SOLN: 4.0000 mg | INTRAMUSCULAR | Status: DC | PRN

## 2021-06-23 NOTE — Discharge Instructions (Signed)
Vaginal Delivery, Care After Refer to this sheet in the next few weeks. These discharge instructions provide you with information on caring for yourself after delivery. Your caregiver may also give you specific instructions. Your treatment has been planned according to the most current medical practices available, but problems sometimes occur. Call your caregiver if you have any problems or questions after you go home. HOME CARE INSTRUCTIONS Take over-the-counter or prescription medicines only as directed by your caregiver or pharmacist. Do not drink alcohol, especially if you are breastfeeding or taking medicine to relieve pain. Do not smoke tobacco. Continue to use good perineal care. Good perineal care includes: Wiping your perineum from back to front Keeping your perineum clean. You can do sitz baths twice a day, to help keep this area clean Do not use tampons, douche or have sex until your caregiver says it is okay. Shower only and avoid sitting in submerged water, aside from sitz baths Wear a well-fitting bra that provides breast support. Eat healthy foods. Drink enough fluids to keep your urine clear or pale yellow. Eat high-fiber foods such as whole grain cereals and breads, brown rice, beans, and fresh fruits and vegetables every day. These foods may help prevent or relieve constipation. Avoid constipation with high fiber foods or medications, such as miralax or metamucil Follow your caregiver's recommendations regarding resumption of activities such as climbing stairs, driving, lifting, exercising, or traveling. Talk to your caregiver about resuming sexual activities. Resumption of sexual activities is dependent upon your risk of infection, your rate of healing, and your comfort and desire to resume sexual activity. Try to have someone help you with your household activities and your newborn for at least a few days after you leave the hospital. Rest as much as possible. Try to rest or  take a nap when your newborn is sleeping. Increase your activities gradually. Keep all of your scheduled postpartum appointments. It is very important to keep your scheduled follow-up appointments. At these appointments, your caregiver will be checking to make sure that you are healing physically and emotionally. SEEK MEDICAL CARE IF:  You are passing large clots from your vagina. Save any clots to show your caregiver. You have a foul smelling discharge from your vagina. You have trouble urinating. You are urinating frequently. You have pain when you urinate. You have a change in your bowel movements. You have increasing redness, pain, or swelling near your vaginal incision (episiotomy) or vaginal tear. You have pus draining from your episiotomy or vaginal tear. Your episiotomy or vaginal tear is separating. You have painful, hard, or reddened breasts. You have a severe headache. You have blurred vision or see spots. You feel sad or depressed. You have thoughts of hurting yourself or your newborn. You have questions about your care, the care of your newborn, or medicines. You are dizzy or light-headed. You have a rash. You have nausea or vomiting. You were breastfeeding and have not had a menstrual period within 12 weeks after you stopped breastfeeding. You are not breastfeeding and have not had a menstrual period by the 12th week after delivery. You have a fever. SEEK IMMEDIATE MEDICAL CARE IF:  You have persistent pain. You have chest pain. You have shortness of breath. You faint. You have leg pain. You have stomach pain. Your vaginal bleeding saturates two or more sanitary pads in 1 hour. MAKE SURE YOU:  Understand these instructions. Will watch your condition. Will get help right away if you are not doing well or   get worse. Document Released: 12/13/2000 Document Revised: 05/02/2014 Document Reviewed: 08/12/2012 ExitCare Patient Information 2015 [employer]. This  information is not intended to replace advice given to you by your health care provider. Make sure you discuss any questions you have with your health care provider.  Sitz Bath A sitz bath is a warm water bath taken in the sitting position. The water covers only the hips and butt (buttocks). We recommend using one that fits in the toilet, to help with ease of use and cleanliness. It may be used for either healing or cleaning purposes. Sitz baths are also used to relieve pain, itching, or muscle tightening (spasms). The water may contain medicine. Moist heat will help you heal and relax.  HOME CARE  Take 3 to 4 sitz baths a day. Fill the bathtub half-full with warm water. Sit in the water and open the drain a little. Turn on the warm water to keep the tub half-full. Keep the water running constantly. Soak in the water for 15 to 20 minutes. After the sitz bath, pat the affected area dry. GET HELP RIGHT AWAY IF: You get worse instead of better. Stop the sitz baths if you get worse. MAKE SURE YOU: Understand these instructions. Will watch your condition. Will get help right away if you are not doing well or get worse. Document Released: 01/23/2005 Document Revised: 09/09/2012 Document Reviewed: 04/15/2011 ExitCare Patient Information 2015 [employer]. This information is not intended to replace advice given to you by your health care provider. Make sure you discuss any questions you have with your health care provider.   

## 2021-06-23 NOTE — Progress Notes (Addendum)
Postpartum Day  0  Subjective: no complaints, up ad lib, voiding, and tolerating PO  Doing well, no concerns. Ambulating without difficulty, pain managed with PO meds, tolerating regular diet, and voiding without difficulty.   No fever/chills, chest pain, shortness of breath, nausea/vomiting, or leg pain. No nipple or breast pain. No headache, visual changes, or RUQ/epigastric pain.  Objective: BP 134/76 (BP Location: Right Arm)   Pulse 81   Temp 98.1 F (36.7 C) (Oral)   Resp 18   Ht 5\' 5"  (1.651 m)   Wt 114.8 kg   LMP 09/15/2020 Comment: normal  SpO2 99%   Breastfeeding Unknown   BMI 42.10 kg/m    Physical Exam:  General: alert, cooperative, and no distress Breasts: soft/nontender CV: RRR Pulm: nl effort, CTABL Abdomen: soft, non-tender, active bowel sounds Uterine Fundus: firm Perineum: minimal edema, intact Lochia: appropriate DVT Evaluation: No evidence of DVT seen on physical exam.  Recent Labs    06/22/21 1404 06/23/21 0545  HGB 11.2* 10.0*  HCT 32.2* 29.3*  WBC 11.9* 14.8*  PLT 214 172    Assessment/Plan: 34 y.o. 06/25/21 postpartum day # 0  -Continue routine postpartum care -Lactation consult PRN for breastfeeding  -Discussed contraceptive options including implant, IUDs hormonal and non-hormonal, injection, pills/ring/patch, condoms, and NFP. Considering Skyla versus Liletta.  Leaning more towards Liletta  -Acute blood loss anemia - hemodynamically stable and asymptomatic; start PO ferrous sulfate BID with stool softeners  -Immunization status:    all immunizations up to date, rubella status pending  -Blood pressures as follows: 06/23/21 11:26:28 140/76  06/23/21 07:39:35 134/76  06/23/21 06:09:15 140/89  06/23/21 0500 133/83  -Will start with procardia 30mg  XL daily    Disposition: Continue inpatient postpartum care    LOS: 1 day   06/25/21, CNM 06/23/2021, 10:11 AM   ----- Gustavo Lah  Certified Nurse Midwife [facility]  OB/GYN [facility]

## 2021-06-23 NOTE — Progress Notes (Signed)
Labor Progress Note  Anne Harrington is a 35 y.o. [id number] at 101w1d by LMP admitted for gestational hypertenstion.  Subjective: Feeling more comfortable after her epidural  Objective: BP 132/84   Pulse 79   Temp 98.8 F (37.1 C) (Oral)   Resp 18   Ht 5\' 5"  (1.651 m)   Wt 114.8 kg   LMP 09/15/2020 Comment: normal  SpO2 100%   BMI 42.10 kg/m  Notable VS details: reviewed. Several mild range b/p's  Fetal Assessment: FHT:  FHR: 145 bpm, variability: moderate,  accelerations:  Present,  decelerations:  Present variables  Category/reactivity:  Category II UC:   regular, every 2-3 minutes SVE:   8/90/0 Membrane status: AROM at 0046 Amniotic color: Meconium  Labs: Lab Results  Component Value Date   WBC 11.9 (H) 06/22/2021   HGB 11.2 (L) 06/22/2021   HCT 32.2 (L) 06/22/2021   MCV 90.7 06/22/2021   PLT 214 06/22/2021    Assessment / Plan: Induction of labor due to gestational hypertension,  progressing well on pitocin  Labor: Progressing normally Preeclampsia:   normal to mild range b/p's  Fetal Wellbeing:  Category II Pain Control:  Epidural I/D:  n/a Anticipated MOD:  NSVD  06/24/2021, CNM 06/23/2021, 12:54 AM

## 2021-06-23 NOTE — Anesthesia Postprocedure Evaluation (Signed)
Anesthesia Post Note  Patient: SHIMIKA AMES  Procedure(s) Performed: AN AD HOC LABOR EPIDURAL  Patient location during evaluation: Mother Baby Anesthesia Type: Epidural Level of consciousness: awake and alert Pain management: pain level controlled Vital Signs Assessment: post-procedure vital signs reviewed and stable Respiratory status: spontaneous breathing, nonlabored ventilation and respiratory function stable Cardiovascular status: stable Postop Assessment: no headache, no backache and epidural receding Anesthetic complications: no   No notable events documented.   Last Vitals:  Vitals:   06/23/21 0609 06/23/21 0739  BP: 140/89 134/76  Pulse: 83 81  Resp: 20 18  Temp: 36.9 C 36.7 C  SpO2: 100% 99%    Last Pain:  Vitals:   06/23/21 0803  TempSrc:   PainSc: 4                  Demarion Pondexter

## 2021-06-23 NOTE — Discharge Summary (Signed)
Obstetrical Discharge Summary  Patient Name: Anne Harrington DOB: 09/20/1986 MRN: [id number]  Date of Admission: 06/22/2021 Date of Delivery: 06/23/2021 Delivered by: Margaretmary Eddy, CNM  Date of Discharge: 06/24/2021  Primary OB: [facility] WLN:[id number] last menstrual period was 09/15/2020. EDC Estimated Date of Delivery: 06/22/21 Gestational Age at Delivery: [redacted]w[redacted]d   Antepartum complications:  Gestational Hypertension Rh neg - Rhogam given 04/02/2021 Obesity in pregnancy AMA  Admitting Diagnosis: Gestational hypertension Secondary Diagnosis: Patient Active Problem List   Diagnosis Date Noted   Indication for care in labor or delivery 06/22/2021   Gestational hypertension 06/22/2021   Fetal renal anomaly, single gestation 01/26/2021   Abnormal blood findings 12/11/2020   Rh negative state in antepartum period 12/08/2020   Supervision of high risk pregnancy, antepartum 12/07/2020   Obesity in pregnancy, antepartum 12/07/2020   Advanced maternal age, primigravida, antepartum: 35 yo 12/07/2020   History of migraine headaches 09/26/2016   History of abnormal cervical Pap smear 09/26/2016   Cervical dysplasia 07/05/2015   History of asthma 09/13/2014    Induction: AROM, Pitocin, and Cytotec Complications: None Intrapartum complications/course: Anne Harrington presented to L&D for evaluation of elevated blood pressures in the office.  She was admitted for IOL d/t gestational hypertension.  She progressed well after 1 dose of misoprostol and low dose oxytocin.  She had a spontaneous urge to push after AROM and pushed effectively over 2 contractions for a spontaneous birth. Delivery Type: spontaneous vaginal delivery Anesthesia: epidural Placenta: spontaneous Laceration: 1st vaginal with repair  Episiotomy: none Newborn Data: Live born female "Anne Harrington" Birth Weight: 8 lb 15.2 oz (4060 g) APGAR: 8, 9  Newborn Delivery   Time head delivered: 06/23/2021 02:04:00 Birth date/time:  06/23/2021 02:04:00 Delivery type: Vaginal, Spontaneous     Postpartum Procedures: None  Edinburgh:  Edinburgh Postnatal Depression Scale Screening Tool 06/23/2021 06/23/2021  I have been able to laugh and see the funny side of things. (No Data) 0  I have looked forward with enjoyment to things. - 0  I have blamed myself unnecessarily when things went wrong. - 1  I have been anxious or worried for no good reason. - 0  I have felt scared or panicky for no good reason. - 0  Things have been getting on top of me. - 0  I have been so unhappy that I have had difficulty sleeping. - 0  I have felt sad or miserable. - 0  I have been so unhappy that I have been crying. - 1  The thought of harming myself has occurred to me. - 0  Edinburgh Postnatal Depression Scale Total - 2     Post partum course:  Patient had an uncomplicated postpartum course.  By time of discharge on PPD#1, her pain was controlled on oral pain medications; she had appropriate lochia and was ambulating, voiding without difficulty and tolerating regular diet.  Blood pressures were controlled with oral antihypertensives. She was deemed stable for discharge to home.    Discharge Physical Exam:  BP 140/85 (BP Location: Right Arm)   Pulse 92   Temp 97.8 F (36.6 C) (Oral)   Resp 18   Ht 5\' 5"  (1.651 m)   Wt 114.8 kg   LMP 09/15/2020 Comment: normal  SpO2 100%   Breastfeeding Unknown   BMI 42.10 kg/m   General: NAD CV: RRR Pulm: CTABL, nl effort ABD: s/nd/nt, fundus firm and below the umbilicus Lochia: moderate Perineum: minimal edema/repair well approximated DVT Evaluation: LE non-ttp, no evidence of  DVT on exam.  Hemoglobin  Date Value Ref Range Status  06/23/2021 10.0 (L) 12.0 - 15.0 g/dL Final  10/15/5101 58.5 11.1 - 15.9 g/dL Final   HCT  Date Value Ref Range Status  06/23/2021 29.3 (L) 36.0 - 46.0 % Final   Hematocrit  Date Value Ref Range Status  12/07/2020 36.8 34.0 - 46.6 % Final     Disposition:  stable, discharge to home. Baby Feeding: breastmilk Baby Disposition: home with mom  Rh Immune globulin given: Neg - Rhogam not indicated, Baby A neg Rubella vaccine given: Immune  Varivax vaccine given: Immune  Flu vaccine given in AP or PP setting: declined  Tdap vaccine given in AP or PP setting: given 04/02/2021  Contraception: Liletta IUD  Prenatal Labs:  Blood type/Rh O neg, Rhogam given 04/02/2021  Antibody screen neg  Rubella Immune   Varicella Immune  RPR NR  HBsAg Neg  HIV NR  GC neg  Chlamydia neg  Genetic screening cfDNA negative, AFP neg  1 hour GTT 112  3 hour GTT N/A   GBS Negative     Plan:  Anne Harrington was discharged to home in good condition. Follow-up appointment in 1 week for blood pressure check and with delivering provider in 6 weeks.  Discharge Medications: Allergies as of 06/24/2021       Reactions   Latex Itching        Medication List     TAKE these medications    acetaminophen 500 MG tablet Commonly known as: TYLENOL Take 2 tablets (1,000 mg total) by mouth every 6 (six) hours as needed (for pain scale < 4).   ibuprofen 600 MG tablet Commonly known as: ADVIL Take 1 tablet (600 mg total) by mouth every 6 (six) hours as needed for mild pain or moderate pain.   NIFEdipine 30 MG 24 hr tablet Commonly known as: ADALAT CC Take 1 tablet (30 mg total) by mouth daily.   OMEGA 3 PO Take 2,500 mg by mouth daily. (2.5 gram packet)   PRENATAL VITAMIN PO Take 2 tablets by mouth 1 day or 1 dose. 2 gummies per day         Follow-up Information     [facility]. Schedule an appointment as soon as possible for a visit in 1 week(s).   Why: blood pressure check on Thursday or Friday Contact information: [address] [employer] [place] [address] [phone]        [facility]. Schedule an appointment as soon as possible for a visit in 6 week(s).   Why: postpartum visit and IUD  placement Contact information: [address] [place] [address] [phone]                Signed:  Margaretmary Eddy, CNM Certified Nurse Midwife [facility]

## 2021-06-24 MED ORDER — IBUPROFEN 600 MG PO TABS: 600.0000 mg | ORAL_TABLET | Freq: Four times a day (QID) | ORAL | 0 refills | Status: DC | PRN

## 2021-06-24 MED ORDER — ACETAMINOPHEN 500 MG PO TABS: 1000.0000 mg | ORAL_TABLET | Freq: Four times a day (QID) | ORAL | 0 refills | Status: DC | PRN

## 2021-06-24 MED ORDER — NIFEDIPINE ER 30 MG PO TB24: 30.0000 mg | ORAL_TABLET | Freq: Every day | ORAL | 1 refills | Status: DC

## 2021-06-24 NOTE — Progress Notes (Signed)
Patient discharged home with infant. Discharge instructions, prescriptions and follow up appointment given to and reviewed with patient. Patient verbalized understanding  

## 2021-06-25 ENCOUNTER — Ambulatory Visit: Payer: Self-pay

## 2021-08-06 ENCOUNTER — Ambulatory Visit: Payer: Medicaid Other

## 2021-08-08 ENCOUNTER — Telehealth: Payer: Self-pay

## 2021-08-08 NOTE — Telephone Encounter (Signed)
Contacted patient regarding PP and IUD placement appointment on 08/09/21. Notified patient there is not a provider available for IUD placement and gave her the option to reschedule the IUD placement another day or reschedule the entire appointment for another day.  Patient states she preferred rescheduling the entire appointment.  Post-partum and IUD placement appointment rescheduled for Tuesday 08/14/21 at 2:40 arrival time.   Floy Sabina, RN

## 2021-08-09 ENCOUNTER — Ambulatory Visit: Payer: Medicaid Other

## 2021-08-14 ENCOUNTER — Other Ambulatory Visit: Payer: Self-pay

## 2021-08-14 ENCOUNTER — Encounter: Payer: Self-pay | Admitting: Family Medicine

## 2021-08-14 ENCOUNTER — Ambulatory Visit: Payer: Medicaid Other | Admitting: Family Medicine

## 2021-08-14 DIAGNOSIS — Z30430 Encounter for insertion of intrauterine contraceptive device: Secondary | ICD-10-CM

## 2021-08-14 DIAGNOSIS — Z3009 Encounter for other general counseling and advice on contraception: Secondary | ICD-10-CM

## 2021-08-14 LAB — WET PREP FOR TRICH, YEAST, CLUE
Trichomonas Exam: NEGATIVE
Yeast Exam: NEGATIVE

## 2021-08-14 LAB — HEMOGLOBIN, FINGERSTICK: Hemoglobin: 12.1 g/dL (ref 11.1–15.9)

## 2021-08-14 MED ORDER — LEVONORGESTREL 20.1 MCG/DAY IU IUD
1.0000 | INTRAUTERINE_SYSTEM | Freq: Once | INTRAUTERINE | Status: AC
  Administered 2021-08-14: 1 via INTRAUTERINE

## 2021-08-14 NOTE — Progress Notes (Signed)
Post Partum Exam  Anne Harrington is a 35 y.o. [phone] female who presents for a postpartum visit. She is  [redacted]w[redacted]d      postpartum following a spontaneous vaginal delivery. I have fully reviewed the prenatal and intrapartum course. The delivery was at 40w 1 d gestational weeks.  Anesthesia: epidural. Postpartum course has been going pretty, well. Baby's course has been great.  Pecola Leisure is feeding by Bottle Bleeding no bleeding. Bowel function is normal. Bladder function is normal. Patient is not sexually active. Contraception method is abstinence.   Postpartum depression screening:  Edinburgh Postnatal Depression Scale - 08/14/21 1518       Edinburgh Postnatal Depression Scale:  In the Past 7 Days   I have been able to laugh and see the funny side of things. 0    I have looked forward with enjoyment to things. 0    I have blamed myself unnecessarily when things went wrong. 1    I have been anxious or worried for no good reason. 2    I have felt scared or panicky for no good reason. 0    Things have been getting on top of me. 1    I have been so unhappy that I have had difficulty sleeping. 0    I have felt sad or miserable. 0    I have been so unhappy that I have been crying. 0    The thought of harming myself has occurred to me. 0    Edinburgh Postnatal Depression Scale Total 4              The following portions of the patient's history were reviewed and updated as appropriate: allergies, current medications, past family history, past medical history, past social history, past surgical history, and problem list. Last pap smear done  and was Normal  Review of Systems Pertinent items are noted in HPI.    Objective:  BP 128/79   Pulse 78   Temp 98.6 F (37 C) (Oral)   Resp 16   Ht 5\' 5"  (1.651 m)   Wt 228 lb (103.4 kg)   BMI 37.94 kg/m   Gen: well appearing, NAD HEENT: no scleral icterus CV: RR Lung: Normal WOB Breast:performed-yes  Ext: warm well perfused  GU:  performed  Rectal: performed -  yes       Assessment:     postpartum exam. Pap smear not done at today's visit.   Plan:   Essential components of care per ACOG recommendations for Comprehensive Postpartum exam:  1.  Mood and well being: Patient with negative depression screening today. Reviewed local resources for support. EPDS is low risk. Reviewed resources and that mood sx in first year after pregnancy are considered related to pregnancy and to reach out for help at [facility] if needed. Discussed [facility] as link to care and availability of LCSW for counseling  - Patient does not use tobacco. If using tobacco we discussed reduction and for recently cessation risk of relapse - hx of drug use? No     2. Infant care and feeding:  -Patient currently breastmilk feeding? No If breastmilk feeding discussed return to work and pumping. If needed, patient was provided letter for work to allow for every 2-3 hr pumping breaks, and to be granted a private location to express breastmilk and refrigerated area to store breastmilk. Reviewed importance of draining breast regularly to support lactation. I  -Recommended patient engage with WIC/BFpeer counselors  -Counseled to sign  new child up for [facility] services -Social determinants of health (SDOH) reviewed in EPIC. No concerns.   3. Sexuality, contraception and birth spacing  Contraception: Contraception counseling: Reviewed all forms of birth control options in the tiered based approach. available including abstinence; over the counter/barrier methods; hormonal contraceptive medication including pill, patch, ring, injection,contraceptive implant; hormonal and nonhormonal IUDs; permanent sterilization options including vasectomy and the various tubal sterilization modalities. Risks, benefits, and typical effectiveness rates were reviewed.  Questions were answered.  Written information was also given to the patient to review.  Patient desires IUD , this was prescribed  for patient. She will follow up in  as needed  for surveillance.  She was told to call with any further questions, or with any concerns about this method of contraception.  Emphasized use of condoms 100% of the time for STI prevention.  Patient was offered ECP. ECP was not accepted by the patient. ECP counseling was not given - see RN documentation  - Patient does not want a pregnancy in the next year.  Desired family size is 2 children.  - Reviewed forms of contraception in tiered fashion. Patient desired IUD today.   - Discussed birth spacing of 18 months  4. Sleep and fatigue -Encouraged family/partner/community support of 4 hrs of uninterrupted sleep to help with mood and fatigue  5. Physical Recovery  - Discussed patients delivery and complications - Patient had a 1st  degree laceration, perineal healing reviewed. Patient expressed understanding - Patient has urinary incontinence? No - Patient is safe to resume physical and sexual activity  6.  Health Maintenance/Chronic Disease Health Maintenance Due  Topic Date Due   COVID-19 Vaccine (1) Never done   INFLUENZA VACCINE  07/30/2021    - Last pap smear performed 07/28/2018 and was normal with negative HPV. Mammogram- not indicated   There are no diagnoses linked to this encounter.  Patient given handout about PCP care in the community Given MVI per family planning program guidelines and availability  Follow up in: 1  year   or as needed.   8. Postpartum exam  - WET PREP FOR TRICH, YEAST, CLUE - Hemoglobin, venipuncture  9. Encounter for IUD insertion  Patient presented to [facility] for IUD insertion. Her GC/CT screening was found to be up to date and using WHO criteria we can be reasonably certain she is not pregnant or a pregnancy test was obtained which was Urine pregnancy test  today was N/A.  See Flowsheet for IUD check list  IUD Insertion Procedure Note Patient identified, informed consent performed, consent signed.    Discussed risks of irregular bleeding, cramping, infection, malpositioning or misplacement of the IUD outside the uterus which may require further procedure such as laparoscopy. Time out was performed.    Speculum placed in the vagina.  Cervix visualized.  Cleaned with Betadine x 2.  Grasped anteriorly with a single tooth tenaculum.  Uterus sounded to 7 cm.  IUD placed per manufacturer's recommendations.  Strings trimmed to 3 cm. Tenaculum was removed, good hemostasis noted.  Patient tolerated procedure well.   Patient was given post-procedure instructions- both agency handout and verbally by provider.  She was advised to have backup contraception for one week.  Patient was also asked to check IUD strings periodically or follow up in 4 weeks for IUD check.  - levonorgestrel (LILETTA) 20.1 MCG/DAY IUD 1 each  Wendi Snipes, FNP

## 2021-08-14 NOTE — Progress Notes (Signed)
Wet prep reviewed during clinic visit. No treatment indicated.   Inayah Woodin, RN  

## 2021-08-20 ENCOUNTER — Telehealth: Payer: Self-pay | Admitting: Family Medicine

## 2021-08-20 NOTE — Telephone Encounter (Signed)
Was given hand written message after 5 pm that patient has concerns and wants IUD removed d/t chest pain.    Call to patient to discuss chest pain.  Patient reports sharp pain around heart that lasted about 5 min.  Patient is taking Nifedipine that was given at hospital d/t elevated blood pressure at delivery.  Patient has been taking during PP.  B/P at [facility] visit was 128/79.    Patient reports that she had this pain before with [facility] and also towards end of pregnancy.    Discussed with patient to to to ER if  pain happens again tonight.  To contact PCP or UC for visit with EKG and further evaluation and to schedule IUD removal when ready.    Patient is concerned that IUD is causing chest pain, but reminded patient that she was having pain before IUD was placed.    Patient to call to schedule appointment after seeking care from PCP or UC     Wendi Snipes, FNP

## 2021-08-23 ENCOUNTER — Telehealth: Payer: Self-pay | Admitting: Family Medicine

## 2021-08-23 NOTE — Telephone Encounter (Signed)
Patient called and said she had IUD inserted and has been having issues with very bad headaches, dizziness and other symptoms.  Would like IUD removed and has an appointment Monday.  Would like to speak to someone if she can.

## 2021-08-24 ENCOUNTER — Encounter: Payer: Self-pay | Admitting: Advanced Practice Midwife

## 2021-08-24 ENCOUNTER — Ambulatory Visit (LOCAL_COMMUNITY_HEALTH_CENTER): Payer: Medicaid Other | Admitting: Advanced Practice Midwife

## 2021-08-24 ENCOUNTER — Other Ambulatory Visit: Payer: Self-pay

## 2021-08-24 VITALS — BP 135/90 | HR 78 | Ht 65.0 in | Wt 224.4 lb

## 2021-08-24 DIAGNOSIS — Z30011 Encounter for initial prescription of contraceptive pills: Secondary | ICD-10-CM | POA: Diagnosis not present

## 2021-08-24 DIAGNOSIS — Z3009 Encounter for other general counseling and advice on contraception: Secondary | ICD-10-CM

## 2021-08-24 DIAGNOSIS — E669 Obesity, unspecified: Secondary | ICD-10-CM | POA: Insufficient documentation

## 2021-08-24 DIAGNOSIS — Z30432 Encounter for removal of intrauterine contraceptive device: Secondary | ICD-10-CM | POA: Diagnosis not present

## 2021-08-24 DIAGNOSIS — Z720 Tobacco use: Secondary | ICD-10-CM | POA: Diagnosis not present

## 2021-08-24 DIAGNOSIS — R030 Elevated blood-pressure reading, without diagnosis of hypertension: Secondary | ICD-10-CM | POA: Insufficient documentation

## 2021-08-24 MED ORDER — NORETHINDRONE 0.35 MG PO TABS: 1.0000 | ORAL_TABLET | Freq: Every day | ORAL | 12 refills | Status: AC

## 2021-08-24 NOTE — Progress Notes (Signed)
Contraception/Family Planning VISIT ENCOUNTER NOTE  Subjective:   Anne Harrington is a 35 y.o. SWF vaper [phone] (12, 2 mo) female here for reproductive life counseling.  Desires IUD removal for BCM.  Reports she does not want a pregnancy in the next year. Denies abnormal vaginal bleeding, discharge, pelvic pain, problems with intercourse or other gynecologic concerns. SVD 06/23/21 M with pp exam and Liletta inserted 08/14/21 (BP was 128/79 then). Last sex 04/2021. Denies menses or bleeding. Wants Liletta removed because of h/a, chest pain, dizziness, migraines. Saw her MD this week and he did an EKG. Last h/a yesterday resolves with Advil. Migraines dx'd 2017. Last pap 07/28/18 neg HPV neg. Last cig 2020. Last vaped 1 week ago. Last MJ 2021. Quit working. Bottlefeeding.   Gynecologic History No LMP recorded (lmp unknown). Contraception: IUD  Health Maintenance Due  Topic Date Due   COVID-19 Vaccine (1) Never done   INFLUENZA VACCINE  07/30/2021     The following portions of the patient's history were reviewed and updated as appropriate: allergies, current medications, past family history, past medical history, past social history, past surgical history and problem list.  Review of Systems Eyes: negative   Objective:  BP 135/90 (BP Location: Right Arm)   Pulse 78   Ht 5\' 5"  (1.651 m)   Wt 224 lb 6.4 oz (101.8 kg)   LMP  (LMP Unknown)   Breastfeeding No   BMI 37.34 kg/m  Gen: well appearing, NAD HEENT: no scleral icterus CV: RR Lung: Normal WOB Ext: warm well perfused  PELVIC: Normal appearing external genitalia; normal appearing vaginal mucosa and cervix.  No abnormal discharge noted.  Normal uterine size, no other palpable masses, no uterine or adnexal tenderness.   IUD Removal  Patient identified, informed consent performed, consent signed.  Patient was in the dorsal lithotomy position, normal external genitalia was noted.  A speculum was placed in the patient's vagina, normal  discharge was noted, no lesions. The cervix was visualized, no lesions, no abnormal discharge.  The strings of the IUD were grasped and pulled using ring forceps. The IUD was removed in its entirety.   Patient tolerated the procedure well.    Patient will use Micronor for contraception.   Routine preventative health maintenance measures emphasized.    Assessment and Plan:   Contraception counseling: Reviewed all forms of birth control options in the tiered based approach. available including abstinence; over the counter/barrier methods; hormonal contraceptive medication including pill, patch, ring, injection,contraceptive implant, ECP; hormonal and nonhormonal IUDs; permanent sterilization options including vasectomy and the various tubal sterilization modalities. Risks, benefits, and typical effectiveness rates were reviewed.  Questions were answered.  Written information was also given to the patient to review.  Patient desires Micronor, this was prescribed for patient. She will follow up in  prn for surveillance.  She was told to call with any further questions, or with any concerns about this method of contraception.  Emphasized use of condoms 100% of the time for STI prevention.  Patient was offered ECP. ECP was not accepted by the patient. ECP counseling was not given - see RN documentation  1. Encounter for initial prescription of contraceptive pills Micronor  I po daily to begin today Pt counseled to abstain/back up condoms next 7 days - norethindrone (MICRONOR) 0.35 MG tablet; Take 1 tablet (0.35 mg total) by mouth daily.  Dispense: 28 tablet; Refill: 12 Please give dental list to pt   2. Elevated blood pressure reading 08/24/21=135/90 Gestational HTN  with SVD 06/23/21 and sent home on Nifidipine. Referred to primary care MD for management.     Please refer to After Visit Summary for other counseling recommendations.   Return in about 1 year (around 08/24/2022) for yearly physical  exam.  Alberteen Spindle, CNM [facility] DEPARTMENT

## 2021-08-24 NOTE — Progress Notes (Signed)
Patient was here for IUD removal. Dental list provided. Providers orders completed.

## 2021-08-27 ENCOUNTER — Ambulatory Visit: Payer: Medicaid Other

## 2022-01-06 ENCOUNTER — Encounter: Payer: Self-pay | Admitting: Emergency Medicine

## 2022-01-06 ENCOUNTER — Emergency Department
Admission: EM | Admit: 2022-01-06 | Discharge: 2022-01-06 | Disposition: A | Discharge status: Home / Self Care | Payer: Medicaid Other | Attending: Emergency Medicine | Admitting: Emergency Medicine

## 2022-01-06 ENCOUNTER — Emergency Department: Payer: Medicaid Other

## 2022-01-06 ENCOUNTER — Other Ambulatory Visit: Payer: Self-pay

## 2022-01-06 DIAGNOSIS — R0789 Other chest pain: Secondary | ICD-10-CM | POA: Diagnosis not present

## 2022-01-06 LAB — CBC
HCT: 39.7 % (ref 36.0–46.0)
Hemoglobin: 13.5 g/dL (ref 12.0–15.0)
MCH: 30.8 pg (ref 26.0–34.0)
MCHC: 34 g/dL (ref 30.0–36.0)
MCV: 90.4 fL (ref 80.0–100.0)
Platelets: 247 10*3/uL (ref 150–400)
RBC: 4.39 MIL/uL (ref 3.87–5.11)
RDW: 12.5 % (ref 11.5–15.5)
WBC: 8.9 10*3/uL (ref 4.0–10.5)
nRBC: 0 % (ref 0.0–0.2)

## 2022-01-06 LAB — COMPREHENSIVE METABOLIC PANEL
ALT: 24 U/L (ref 0–44)
AST: 23 U/L (ref 15–41)
Albumin: 4.4 g/dL (ref 3.5–5.0)
Alkaline Phosphatase: 62 U/L (ref 38–126)
Anion gap: 6 (ref 5–15)
BUN: 13 mg/dL (ref 6–20)
CO2: 26 mmol/L (ref 22–32)
Calcium: 9.3 mg/dL (ref 8.9–10.3)
Chloride: 108 mmol/L (ref 98–111)
Creatinine, Ser: 0.73 mg/dL (ref 0.44–1.00)
GFR, Estimated: >60 mL/min (ref >60)
Glucose, Bld: 97 mg/dL (ref 70–99)
Potassium: 4.1 mmol/L (ref 3.5–5.1)
Sodium: 140 mmol/L (ref 135–145)
Total Bilirubin: 0.4 mg/dL (ref 0.3–1.2)
Total Protein: 7.7 g/dL (ref 6.5–8.1)

## 2022-01-06 LAB — URINALYSIS, ROUTINE W REFLEX MICROSCOPIC
Bilirubin Urine: NEGATIVE
Glucose, UA: NEGATIVE mg/dL
Hgb urine dipstick: NEGATIVE
Ketones, ur: NEGATIVE mg/dL
Nitrite: NEGATIVE
Protein, ur: NEGATIVE mg/dL
Specific Gravity, Urine: 1.013 (ref 1.005–1.030)
pH: 8 (ref 5.0–8.0)

## 2022-01-06 LAB — POC URINE PREG, ED: Preg Test, Ur: NEGATIVE

## 2022-01-06 LAB — LIPASE, BLOOD: Lipase: 27 U/L (ref 11–51)

## 2022-01-06 LAB — TROPONIN I (HIGH SENSITIVITY): Troponin I (High Sensitivity): <2 ng/L (ref <18)

## 2022-01-06 NOTE — ED Triage Notes (Signed)
Pt reports intermittent sharp pain under left breast that started about 1.5 hours ago. Pt reports similar episode previously but the pain went away. Pt reports deep breathing makes it worse. Denies cough, congestion or other upper resp sx's.

## 2022-01-06 NOTE — ED Provider Notes (Signed)
[facility] Provider Note    Event Date/Time   First MD Initiated Contact with Patient 01/06/22 1539     (approximate)   History   Chest pain   HPI  Anne Harrington is a 36 y.o. female who presents with complaints of left-sided chest pain.  Patient reports that started approximately 2 hours prior to arrival.  She reports a sharp pain in her left lower chest, it seems to be improved now.  She reports it was worse when she took a deep breath.  No calf pain or swelling.  She reports this happened about 4 months ago and resolved on its own.  No shortness of breath.  No fevers chills or cough.     Physical Exam   Triage Vital Signs: ED Triage Vitals  Enc Vitals Group     BP 01/06/22 1544 (!) 152/109     Pulse Rate 01/06/22 1544 (!) 112     Resp 01/06/22 1544 20     Temp 01/06/22 1544 98 F (36.7 C)     Temp Source 01/06/22 1544 Oral     SpO2 01/06/22 1544 98 %     Weight 01/06/22 1513 101.6 kg (224 lb)     Height 01/06/22 1513 1.651 m (5\' 5" )     Head Circumference --      Peak Flow --      Pain Score 01/06/22 1513 8     Pain Loc --      Pain Edu? --      Excl. in GC? --     Most recent vital signs: Vitals:   01/06/22 1544  BP: (!) 152/109  Pulse: (!) 112  Resp: 20  Temp: 98 F (36.7 C)  SpO2: 98%     General: Awake, no distress.  CV:  Good peripheral perfusion.  Mild chest wall tenderness palpation left lower chest Resp:  Normal effort.  Clear to auscultation bilaterally Abd:  No distention.  Other:  No calf pain or tenderness   ED Results / Procedures / Treatments   Labs (all labs ordered are listed, but only abnormal results are displayed) Labs Reviewed  URINALYSIS, ROUTINE W REFLEX MICROSCOPIC - Abnormal; Notable for the following components:      Result Value   Color, Urine YELLOW (*)    APPearance CLOUDY (*)    Leukocytes,Ua SMALL (*)    Bacteria, UA FEW (*)    All other components within normal limits  LIPASE, BLOOD   COMPREHENSIVE METABOLIC PANEL  CBC  POC URINE PREG, ED  TROPONIN I (HIGH SENSITIVITY)     EKG  ED ECG REPORT I, 03/06/22, the attending physician, personally viewed and interpreted this ECG.  Date: 01/06/2022  Rhythm: normal sinus rhythm QRS Axis: normal Intervals: normal ST/T Wave abnormalities: normal Narrative Interpretation: no evidence of acute ischemia    RADIOLOGY Chest x-ray reviewed by me, no acute abnormality    PROCEDURES:  Critical Care performed:   Procedures   MEDICATIONS ORDERED IN ED: Medications - No data to display   IMPRESSION / MDM / ASSESSMENT AND PLAN / ED COURSE  I reviewed the triage vital signs and the nursing notes.   Patient presents with chest discomfort as above, suspicious for chest wall pain given mild tenderness.  No rash, no bony debilities palpated.  CTA bilaterally, doubt pneumothorax, unlikely to be pneumonia given rapid onset.  Low risk for PE  Lab work reviewed by me, unremarkable.  CBC CMP troponin  all normal.  Chest x-ray is normal  ----------------------------------------- 5:16 PM on 01/06/2022 ----------------------------------------- Patient reports pain has completely resolved.  Given his resolution not consistent with PE, no further necessary at this time, outpatient follow-up recommended.  Return precautions discussed        FINAL CLINICAL IMPRESSION(S) / ED DIAGNOSES   Final diagnoses:  Chest wall pain     Rx / DC Orders   ED Discharge Orders     None        Note:  This document was prepared using Dragon voice recognition software and may include unintentional dictation errors.   Jene Every, MD 01/06/22 [phone]

## 2022-01-06 NOTE — ED Provider Notes (Signed)
Emergency Medicine Provider Triage Evaluation Note  Anne Harrington , a 36 y.o. female  was evaluated in triage.  Pt complains of left-sided chest pain and chest tightness that started 2 hours ago.  Patient states that she had similar pain when her son was born 6 months ago.  Denies shortness of breath.  No nausea, vomiting or abdominal pain.  She has been afebrile at home.  Review of Systems  Positive: Patient has left sided chest pain.  Negative: No nausea, vomiting or abdominal pain.   Physical Exam  BP (!) 152/109    Pulse (!) 112    Temp 98 F (36.7 C) (Oral)    Resp 20    Ht 5\' 5"  (1.651 m)    Wt 101.6 kg    LMP 12/07/2021 (Approximate)    SpO2 98%    BMI 37.28 kg/m  Gen:   Awake, no distress   Resp:  Normal effort  MSK:   Moves extremities without difficulty  Other:    Medical Decision Making  Medically screening exam initiated at 3:46 PM.  Appropriate orders placed.  [name] was informed that the remainder of the evaluation will be completed by another provider, this initial triage assessment does not replace that evaluation, and the importance of remaining in the ED until their evaluation is complete.     Elna Breslow Mulliken, PA-C 01/06/22 1547    03/06/22, MD 01/07/22 [phone]
